# Patient Record
Sex: Male | Born: 1955 | Marital: Single
Health system: Southern US, Community
[De-identification: ages and names within clinical notes are randomized; demographics above are authoritative.]

---

## 2012-01-29 DEATH — deceased

## 2012-07-12 ENCOUNTER — Emergency Department: Payer: Self-pay | Admitting: Emergency Medicine

## 2012-07-12 LAB — CBC
HCT: 39.1 % — ABNORMAL LOW (ref 40.0–52.0)
HGB: 13.3 g/dL (ref 13.0–18.0)
MCH: 33.9 pg (ref 26.0–34.0)
MCV: 100 fL (ref 80–100)
RBC: 3.91 10*6/uL — ABNORMAL LOW (ref 4.40–5.90)
RDW: 17.1 % — ABNORMAL HIGH (ref 11.5–14.5)
WBC: 5.4 10*3/uL (ref 3.8–10.6)

## 2012-07-12 LAB — COMPREHENSIVE METABOLIC PANEL
Anion Gap: 10 (ref 7–16)
Bilirubin,Total: 1.2 mg/dL — ABNORMAL HIGH (ref 0.2–1.0)
Chloride: 105 mmol/L (ref 98–107)
Co2: 27 mmol/L (ref 21–32)
Creatinine: 1.46 mg/dL — ABNORMAL HIGH (ref 0.60–1.30)
EGFR (African American): >60
EGFR (Non-African Amer.): 53 — ABNORMAL LOW
Osmolality: 285 (ref 275–301)
Potassium: 3.8 mmol/L (ref 3.5–5.1)
Sodium: 142 mmol/L (ref 136–145)

## 2012-07-12 LAB — URINALYSIS, COMPLETE
Bacteria: NONE SEEN
Bilirubin,UR: NEGATIVE
Glucose,UR: NEGATIVE mg/dL (ref 0–75)
Leukocyte Esterase: NEGATIVE
Ph: 5 (ref 4.5–8.0)
RBC,UR: 1 /HPF (ref 0–5)
Squamous Epithelial: 1
WBC UR: 2 /HPF (ref 0–5)

## 2012-07-18 ENCOUNTER — Emergency Department: Payer: Self-pay | Admitting: Emergency Medicine

## 2012-07-18 LAB — CBC
Platelet: 131 10*3/uL — ABNORMAL LOW (ref 150–440)
RBC: 3.87 10*6/uL — ABNORMAL LOW (ref 4.40–5.90)
WBC: 8.7 10*3/uL (ref 3.8–10.6)

## 2012-07-18 LAB — COMPREHENSIVE METABOLIC PANEL
Albumin: 2.3 g/dL — ABNORMAL LOW (ref 3.4–5.0)
Alkaline Phosphatase: 123 U/L (ref 50–136)
Bilirubin,Total: 0.8 mg/dL (ref 0.2–1.0)
Calcium, Total: 8 mg/dL — ABNORMAL LOW (ref 8.5–10.1)
Co2: 27 mmol/L (ref 21–32)
Creatinine: 1.1 mg/dL (ref 0.60–1.30)
EGFR (Non-African Amer.): >60
SGOT(AST): 89 U/L — ABNORMAL HIGH (ref 15–37)
SGPT (ALT): 45 U/L (ref 12–78)

## 2012-07-18 LAB — PROTIME-INR
INR: 1.2
Prothrombin Time: 15.4 secs — ABNORMAL HIGH (ref 11.5–14.7)

## 2012-07-18 LAB — URINALYSIS, COMPLETE
Blood: NEGATIVE
Hyaline Cast: 1
Ketone: NEGATIVE
Nitrite: NEGATIVE
Protein: NEGATIVE
Specific Gravity: 1.012 (ref 1.003–1.030)
Squamous Epithelial: NONE SEEN

## 2012-07-18 LAB — MAGNESIUM: Magnesium: 1.8 mg/dL

## 2012-07-19 ENCOUNTER — Emergency Department: Payer: Self-pay | Admitting: Emergency Medicine

## 2012-07-19 LAB — TROPONIN I: Troponin-I: <0.02 ng/mL

## 2012-07-19 LAB — COMPREHENSIVE METABOLIC PANEL
Albumin: 2.4 g/dL — ABNORMAL LOW (ref 3.4–5.0)
Alkaline Phosphatase: 115 U/L (ref 50–136)
Anion Gap: 5 — ABNORMAL LOW (ref 7–16)
BUN: 13 mg/dL (ref 7–18)
Potassium: 4.8 mmol/L (ref 3.5–5.1)
SGOT(AST): 121 U/L — ABNORMAL HIGH (ref 15–37)
SGPT (ALT): 48 U/L (ref 12–78)
Total Protein: 9.1 g/dL — ABNORMAL HIGH (ref 6.4–8.2)

## 2012-07-19 LAB — URINALYSIS, COMPLETE
Glucose,UR: NEGATIVE mg/dL (ref 0–75)
Ketone: NEGATIVE
Leukocyte Esterase: NEGATIVE
Ph: 5 (ref 4.5–8.0)
Protein: NEGATIVE
RBC,UR: 1 /HPF (ref 0–5)
Squamous Epithelial: NONE SEEN

## 2012-07-19 LAB — PROTIME-INR
INR: 1.2
Prothrombin Time: 15.3 secs — ABNORMAL HIGH (ref 11.5–14.7)

## 2012-07-19 LAB — CBC
HCT: 36.9 % — ABNORMAL LOW (ref 40.0–52.0)
HGB: 12.7 g/dL — ABNORMAL LOW (ref 13.0–18.0)
MCH: 33.4 pg (ref 26.0–34.0)
MCV: 97 fL (ref 80–100)
Platelet: 118 10*3/uL — ABNORMAL LOW (ref 150–440)
RBC: 3.81 10*6/uL — ABNORMAL LOW (ref 4.40–5.90)
WBC: 6 10*3/uL (ref 3.8–10.6)

## 2012-07-19 LAB — CK TOTAL AND CKMB (NOT AT ARMC)
CK, Total: 222 U/L (ref 35–232)
CK-MB: 1.7 ng/mL (ref 0.5–3.6)

## 2012-07-23 ENCOUNTER — Emergency Department: Payer: Self-pay | Admitting: Emergency Medicine

## 2012-07-23 LAB — COMPREHENSIVE METABOLIC PANEL
Alkaline Phosphatase: 103 U/L (ref 50–136)
Anion Gap: 7 (ref 7–16)
Bilirubin,Total: 1.7 mg/dL — ABNORMAL HIGH (ref 0.2–1.0)
Chloride: 109 mmol/L — ABNORMAL HIGH (ref 98–107)
Co2: 28 mmol/L (ref 21–32)
Creatinine: 1.27 mg/dL (ref 0.60–1.30)
EGFR (African American): >60
EGFR (Non-African Amer.): >60
Osmolality: 289 (ref 275–301)
Potassium: 3.7 mmol/L (ref 3.5–5.1)
Sodium: 144 mmol/L (ref 136–145)

## 2012-07-23 LAB — CBC
HGB: 12.2 g/dL — ABNORMAL LOW (ref 13.0–18.0)
MCH: 33.3 pg (ref 26.0–34.0)
MCHC: 33.9 g/dL (ref 32.0–36.0)
MCV: 98 fL (ref 80–100)
RBC: 3.65 10*6/uL — ABNORMAL LOW (ref 4.40–5.90)
RDW: 16.4 % — ABNORMAL HIGH (ref 11.5–14.5)

## 2012-07-27 ENCOUNTER — Emergency Department: Payer: Self-pay | Admitting: Unknown Physician Specialty

## 2012-07-27 LAB — COMPREHENSIVE METABOLIC PANEL
Alkaline Phosphatase: 103 U/L (ref 50–136)
Calcium, Total: 8.1 mg/dL — ABNORMAL LOW (ref 8.5–10.1)
Chloride: 107 mmol/L (ref 98–107)
Co2: 24 mmol/L (ref 21–32)
Creatinine: 1.17 mg/dL (ref 0.60–1.30)
Osmolality: 283 (ref 275–301)
Sodium: 139 mmol/L (ref 136–145)

## 2012-07-27 LAB — URINALYSIS, COMPLETE
Nitrite: NEGATIVE
Ph: 5 (ref 4.5–8.0)
Protein: NEGATIVE
RBC,UR: <1 /HPF (ref 0–5)
Squamous Epithelial: NONE SEEN
WBC UR: <1 /HPF (ref 0–5)

## 2012-07-27 LAB — CBC
HCT: 35.7 % — ABNORMAL LOW (ref 40.0–52.0)
HGB: 11.8 g/dL — ABNORMAL LOW (ref 13.0–18.0)
MCHC: 33 g/dL (ref 32.0–36.0)
RBC: 3.64 10*6/uL — ABNORMAL LOW (ref 4.40–5.90)
RDW: 16.3 % — ABNORMAL HIGH (ref 11.5–14.5)

## 2012-07-27 LAB — LIPASE, BLOOD: Lipase: 348 U/L (ref 73–393)

## 2012-07-27 LAB — PROTIME-INR
INR: 1.2
Prothrombin Time: 15.7 secs — ABNORMAL HIGH (ref 11.5–14.7)

## 2012-07-27 LAB — AMMONIA: Ammonia, Plasma: 53 mcmol/L — ABNORMAL HIGH (ref 11–32)

## 2012-08-03 ENCOUNTER — Emergency Department: Payer: Self-pay | Admitting: Emergency Medicine

## 2012-08-07 ENCOUNTER — Emergency Department: Payer: Self-pay | Admitting: *Deleted

## 2012-08-15 ENCOUNTER — Emergency Department: Payer: Self-pay | Admitting: *Deleted

## 2012-08-15 LAB — CBC
HCT: 38.9 % — ABNORMAL LOW (ref 40.0–52.0)
HGB: 13.2 g/dL (ref 13.0–18.0)
MCH: 33.6 pg (ref 26.0–34.0)
MCHC: 34.1 g/dL (ref 32.0–36.0)
MCV: 99 fL (ref 80–100)
Platelet: 108 x10 3/mm 3 — ABNORMAL LOW (ref 150–440)
RBC: 3.94 x10 6/mm 3 — ABNORMAL LOW (ref 4.40–5.90)
RDW: 18.2 % — ABNORMAL HIGH (ref 11.5–14.5)
WBC: 6.3 x10 3/mm 3 (ref 3.8–10.6)

## 2012-08-15 LAB — COMPREHENSIVE METABOLIC PANEL
Alkaline Phosphatase: 129 U/L (ref 50–136)
Anion Gap: 9 (ref 7–16)
Calcium, Total: 8.1 mg/dL — ABNORMAL LOW (ref 8.5–10.1)
Co2: 20 mmol/L — ABNORMAL LOW (ref 21–32)
Creatinine: 1.17 mg/dL (ref 0.60–1.30)
EGFR (Non-African Amer.): >60
SGOT(AST): 129 U/L — ABNORMAL HIGH (ref 15–37)
SGPT (ALT): 48 U/L (ref 12–78)

## 2012-08-15 LAB — AMMONIA: Ammonia, Plasma: 61 mcmol/L — ABNORMAL HIGH (ref 11–32)

## 2012-08-15 LAB — CK TOTAL AND CKMB (NOT AT ARMC)
CK, Total: 153 U/L (ref 35–232)
CK-MB: 2.1 ng/mL (ref 0.5–3.6)

## 2012-08-20 ENCOUNTER — Emergency Department: Payer: Self-pay | Admitting: Emergency Medicine

## 2012-08-20 LAB — CBC
HCT: 36.2 % — ABNORMAL LOW (ref 40.0–52.0)
HGB: 12.2 g/dL — ABNORMAL LOW (ref 13.0–18.0)
MCHC: 33.8 g/dL (ref 32.0–36.0)
MCV: 99 fL (ref 80–100)
RDW: 18.1 % — ABNORMAL HIGH (ref 11.5–14.5)

## 2012-08-20 LAB — COMPREHENSIVE METABOLIC PANEL
Albumin: 2 g/dL — ABNORMAL LOW (ref 3.4–5.0)
Alkaline Phosphatase: 129 U/L (ref 50–136)
Anion Gap: 7 (ref 7–16)
BUN: 7 mg/dL (ref 7–18)
Bilirubin,Total: 1.4 mg/dL — ABNORMAL HIGH (ref 0.2–1.0)
Calcium, Total: 8.1 mg/dL — ABNORMAL LOW (ref 8.5–10.1)
Chloride: 109 mmol/L — ABNORMAL HIGH (ref 98–107)
Co2: 25 mmol/L (ref 21–32)
Creatinine: 1.2 mg/dL (ref 0.60–1.30)
EGFR (African American): >60
EGFR (Non-African Amer.): >60
Osmolality: 282 (ref 275–301)
SGPT (ALT): 56 U/L (ref 12–78)
Sodium: 141 mmol/L (ref 136–145)
Total Protein: 7.9 g/dL (ref 6.4–8.2)

## 2012-08-20 LAB — LIPASE, BLOOD: Lipase: 644 U/L — ABNORMAL HIGH (ref 73–393)

## 2012-08-21 ENCOUNTER — Emergency Department: Payer: Self-pay | Admitting: Unknown Physician Specialty

## 2012-08-27 ENCOUNTER — Emergency Department: Payer: Self-pay | Admitting: Internal Medicine

## 2012-09-05 LAB — COMPREHENSIVE METABOLIC PANEL
Albumin: 2.3 g/dL — ABNORMAL LOW (ref 3.4–5.0)
Anion Gap: 11 (ref 7–16)
BUN: 9 mg/dL (ref 7–18)
Calcium, Total: 8.1 mg/dL — ABNORMAL LOW (ref 8.5–10.1)
Chloride: 110 mmol/L — ABNORMAL HIGH (ref 98–107)
EGFR (African American): >60
Glucose: 126 mg/dL — ABNORMAL HIGH (ref 65–99)
Osmolality: 285 (ref 275–301)
Potassium: 3.8 mmol/L (ref 3.5–5.1)
SGOT(AST): 243 U/L — ABNORMAL HIGH (ref 15–37)
SGPT (ALT): 79 U/L — ABNORMAL HIGH (ref 12–78)

## 2012-09-05 LAB — CBC
MCHC: 34.6 g/dL (ref 32.0–36.0)
MCV: 101 fL — ABNORMAL HIGH (ref 80–100)
Platelet: 107 10*3/uL — ABNORMAL LOW (ref 150–440)
RBC: 3.69 10*6/uL — ABNORMAL LOW (ref 4.40–5.90)
WBC: 7.8 10*3/uL (ref 3.8–10.6)

## 2012-09-05 LAB — URINALYSIS, COMPLETE
Bacteria: NONE SEEN
Bilirubin,UR: NEGATIVE
Blood: NEGATIVE
Glucose,UR: NEGATIVE mg/dL (ref 0–75)
Hyaline Cast: 6
Ketone: NEGATIVE
Nitrite: NEGATIVE
Specific Gravity: 1.014 (ref 1.003–1.030)
Squamous Epithelial: <1
WBC UR: <1 /HPF (ref 0–5)

## 2012-09-05 LAB — PROTIME-INR: Prothrombin Time: 16.8 secs — ABNORMAL HIGH (ref 11.5–14.7)

## 2012-09-05 LAB — AMMONIA: Ammonia, Plasma: 94 mcmol/L — ABNORMAL HIGH (ref 11–32)

## 2012-09-05 LAB — LIPASE, BLOOD: Lipase: 459 U/L — ABNORMAL HIGH (ref 73–393)

## 2012-09-06 ENCOUNTER — Inpatient Hospital Stay: Payer: Self-pay | Admitting: Internal Medicine

## 2012-09-06 LAB — APTT: Activated PTT: 29.8 secs (ref 23.6–35.9)

## 2012-09-07 DIAGNOSIS — R000 Tachycardia, unspecified: Secondary | ICD-10-CM

## 2012-09-07 LAB — CBC WITH DIFFERENTIAL/PLATELET
Basophil %: 0.5 %
Eosinophil %: 9.1 %
HCT: 33.6 % — ABNORMAL LOW (ref 40.0–52.0)
Lymphocyte %: 21.6 %
MCH: 34.7 pg — ABNORMAL HIGH (ref 26.0–34.0)
Monocyte #: 0.8 x10 3/mm (ref 0.2–1.0)
Neutrophil %: 55.5 %
Platelet: 106 10*3/uL — ABNORMAL LOW (ref 150–440)
RBC: 3.33 10*6/uL — ABNORMAL LOW (ref 4.40–5.90)
WBC: 5.7 10*3/uL (ref 3.8–10.6)

## 2012-09-07 LAB — COMPREHENSIVE METABOLIC PANEL
Albumin: 1.8 g/dL — ABNORMAL LOW (ref 3.4–5.0)
Alkaline Phosphatase: 125 U/L (ref 50–136)
BUN: 9 mg/dL (ref 7–18)
Calcium, Total: 7.5 mg/dL — ABNORMAL LOW (ref 8.5–10.1)
Creatinine: 1.24 mg/dL (ref 0.60–1.30)
EGFR (African American): >60
EGFR (Non-African Amer.): >60
Glucose: 131 mg/dL — ABNORMAL HIGH (ref 65–99)
Osmolality: 289 (ref 275–301)
SGPT (ALT): 67 U/L (ref 12–78)
Sodium: 145 mmol/L (ref 136–145)

## 2012-09-07 LAB — LIPASE, BLOOD: Lipase: 360 U/L (ref 73–393)

## 2012-09-08 DIAGNOSIS — I369 Nonrheumatic tricuspid valve disorder, unspecified: Secondary | ICD-10-CM

## 2012-09-08 LAB — CULTURE, BLOOD (SINGLE)

## 2012-09-08 LAB — URINE CULTURE

## 2012-09-10 LAB — CBC WITH DIFFERENTIAL/PLATELET
Basophil #: 0 10*3/uL (ref 0.0–0.1)
Eosinophil #: 0.6 10*3/uL (ref 0.0–0.7)
Eosinophil %: 8.1 %
HCT: 31.9 % — ABNORMAL LOW (ref 40.0–52.0)
HGB: 11.2 g/dL — ABNORMAL LOW (ref 13.0–18.0)
Lymphocyte #: 1.6 10*3/uL (ref 1.0–3.6)
Lymphocyte %: 20.8 %
MCHC: 35.2 g/dL (ref 32.0–36.0)
Monocyte %: 15 %
Neutrophil #: 4.3 10*3/uL (ref 1.4–6.5)
Neutrophil %: 55.6 %
RBC: 3.15 10*6/uL — ABNORMAL LOW (ref 4.40–5.90)
RDW: 18.3 % — ABNORMAL HIGH (ref 11.5–14.5)
WBC: 7.7 10*3/uL (ref 3.8–10.6)

## 2012-09-10 LAB — BASIC METABOLIC PANEL
BUN: 17 mg/dL (ref 7–18)
Chloride: 110 mmol/L — ABNORMAL HIGH (ref 98–107)
EGFR (African American): 41 — ABNORMAL LOW
EGFR (Non-African Amer.): 35 — ABNORMAL LOW
Glucose: 129 mg/dL — ABNORMAL HIGH (ref 65–99)
Osmolality: 286 (ref 275–301)
Potassium: 3.9 mmol/L (ref 3.5–5.1)
Sodium: 142 mmol/L (ref 136–145)

## 2012-09-11 LAB — BASIC METABOLIC PANEL
Anion Gap: 7 (ref 7–16)
BUN: 16 mg/dL (ref 7–18)
Calcium, Total: 6.7 mg/dL — CL (ref 8.5–10.1)
Chloride: 113 mmol/L — ABNORMAL HIGH (ref 98–107)
Co2: 23 mmol/L (ref 21–32)
Creatinine: 1.78 mg/dL — ABNORMAL HIGH (ref 0.60–1.30)
Osmolality: 285 (ref 275–301)
Potassium: 3.9 mmol/L (ref 3.5–5.1)

## 2012-09-11 LAB — CBC WITH DIFFERENTIAL/PLATELET
Bands: 1 %
HGB: 9.8 g/dL — ABNORMAL LOW (ref 13.0–18.0)
Lymphocytes: 24 %
Platelet: 109 10*3/uL — ABNORMAL LOW (ref 150–440)
RBC: 2.91 10*6/uL — ABNORMAL LOW (ref 4.40–5.90)
RDW: 18.2 % — ABNORMAL HIGH (ref 11.5–14.5)
Segmented Neutrophils: 62 %
Variant Lymphocyte - H1-Rlymph: 1 %

## 2012-09-11 LAB — ALBUMIN: Albumin: 1.6 g/dL — ABNORMAL LOW (ref 3.4–5.0)

## 2012-09-12 DIAGNOSIS — R7881 Bacteremia: Secondary | ICD-10-CM

## 2012-09-12 LAB — COMPREHENSIVE METABOLIC PANEL
Albumin: 1.7 g/dL — ABNORMAL LOW (ref 3.4–5.0)
BUN: 14 mg/dL (ref 7–18)
Chloride: 112 mmol/L — ABNORMAL HIGH (ref 98–107)
Co2: 23 mmol/L (ref 21–32)
Glucose: 150 mg/dL — ABNORMAL HIGH (ref 65–99)
Osmolality: 285 (ref 275–301)
Potassium: 4.4 mmol/L (ref 3.5–5.1)
SGOT(AST): 356 U/L — ABNORMAL HIGH (ref 15–37)
Total Protein: 6.9 g/dL (ref 6.4–8.2)

## 2012-09-12 LAB — CLOSTRIDIUM DIFFICILE BY PCR

## 2012-09-13 LAB — BASIC METABOLIC PANEL
BUN: 12 mg/dL (ref 7–18)
Calcium, Total: 7.9 mg/dL — ABNORMAL LOW (ref 8.5–10.1)
Chloride: 109 mmol/L — ABNORMAL HIGH (ref 98–107)
Co2: 24 mmol/L (ref 21–32)
Creatinine: 1.95 mg/dL — ABNORMAL HIGH (ref 0.60–1.30)
EGFR (African American): 43 — ABNORMAL LOW
EGFR (Non-African Amer.): 37 — ABNORMAL LOW
Osmolality: 280 (ref 275–301)

## 2012-09-14 LAB — URINALYSIS, COMPLETE
Bacteria: NONE SEEN
Glucose,UR: NEGATIVE mg/dL (ref 0–75)
Ketone: NEGATIVE
Nitrite: NEGATIVE
Ph: 5 (ref 4.5–8.0)
Protein: NEGATIVE
Squamous Epithelial: NONE SEEN
WBC UR: 3 /HPF (ref 0–5)

## 2012-09-14 LAB — SODIUM, URINE, RANDOM: Sodium, Urine Random: 69 mmol/L (ref 20–110)

## 2012-09-14 LAB — PROTEIN / CREATININE RATIO, URINE: Creatinine, Urine: 70.3 mg/dL (ref 30.0–125.0)

## 2012-09-15 LAB — PROTEIN ELECTROPHORESIS(ARMC)

## 2012-09-16 LAB — BASIC METABOLIC PANEL
Anion Gap: 8 (ref 7–16)
BUN: 12 mg/dL (ref 7–18)
EGFR (African American): 52 — ABNORMAL LOW
EGFR (Non-African Amer.): 45 — ABNORMAL LOW
Glucose: 137 mg/dL — ABNORMAL HIGH (ref 65–99)
Osmolality: 285 (ref 275–301)
Potassium: 4.1 mmol/L (ref 3.5–5.1)
Sodium: 142 mmol/L (ref 136–145)

## 2012-09-18 LAB — BASIC METABOLIC PANEL
Anion Gap: 10 (ref 7–16)
BUN: 12 mg/dL (ref 7–18)
Chloride: 107 mmol/L (ref 98–107)
Co2: 24 mmol/L (ref 21–32)
Creatinine: 1.74 mg/dL — ABNORMAL HIGH (ref 0.60–1.30)
EGFR (African American): 50 — ABNORMAL LOW
Osmolality: 282 (ref 275–301)
Potassium: 4 mmol/L (ref 3.5–5.1)

## 2012-09-19 LAB — CREATININE, SERUM: EGFR (Non-African Amer.): 41 — ABNORMAL LOW

## 2012-09-20 LAB — RENAL FUNCTION PANEL
Albumin: 2.4 g/dL — ABNORMAL LOW (ref 3.4–5.0)
Anion Gap: 9 (ref 7–16)
Calcium, Total: 8 mg/dL — ABNORMAL LOW (ref 8.5–10.1)
Chloride: 108 mmol/L — ABNORMAL HIGH (ref 98–107)
Co2: 25 mmol/L (ref 21–32)
Creatinine: 1.88 mg/dL — ABNORMAL HIGH (ref 0.60–1.30)
EGFR (Non-African Amer.): 39 — ABNORMAL LOW
Glucose: 137 mg/dL — ABNORMAL HIGH (ref 65–99)
Phosphorus: 2.8 mg/dL (ref 2.5–4.9)
Sodium: 142 mmol/L (ref 136–145)

## 2014-07-09 IMAGING — CR DG CHEST 2V
1 series · 2 of 2 positions shown · non-contrast
Comparison: none

REASON FOR EXAM: dyspnea
COMMENTS:

[Series 5: w chest pa · 0.14mm/px · 2 of 2 slices shown]
[im 1/2]
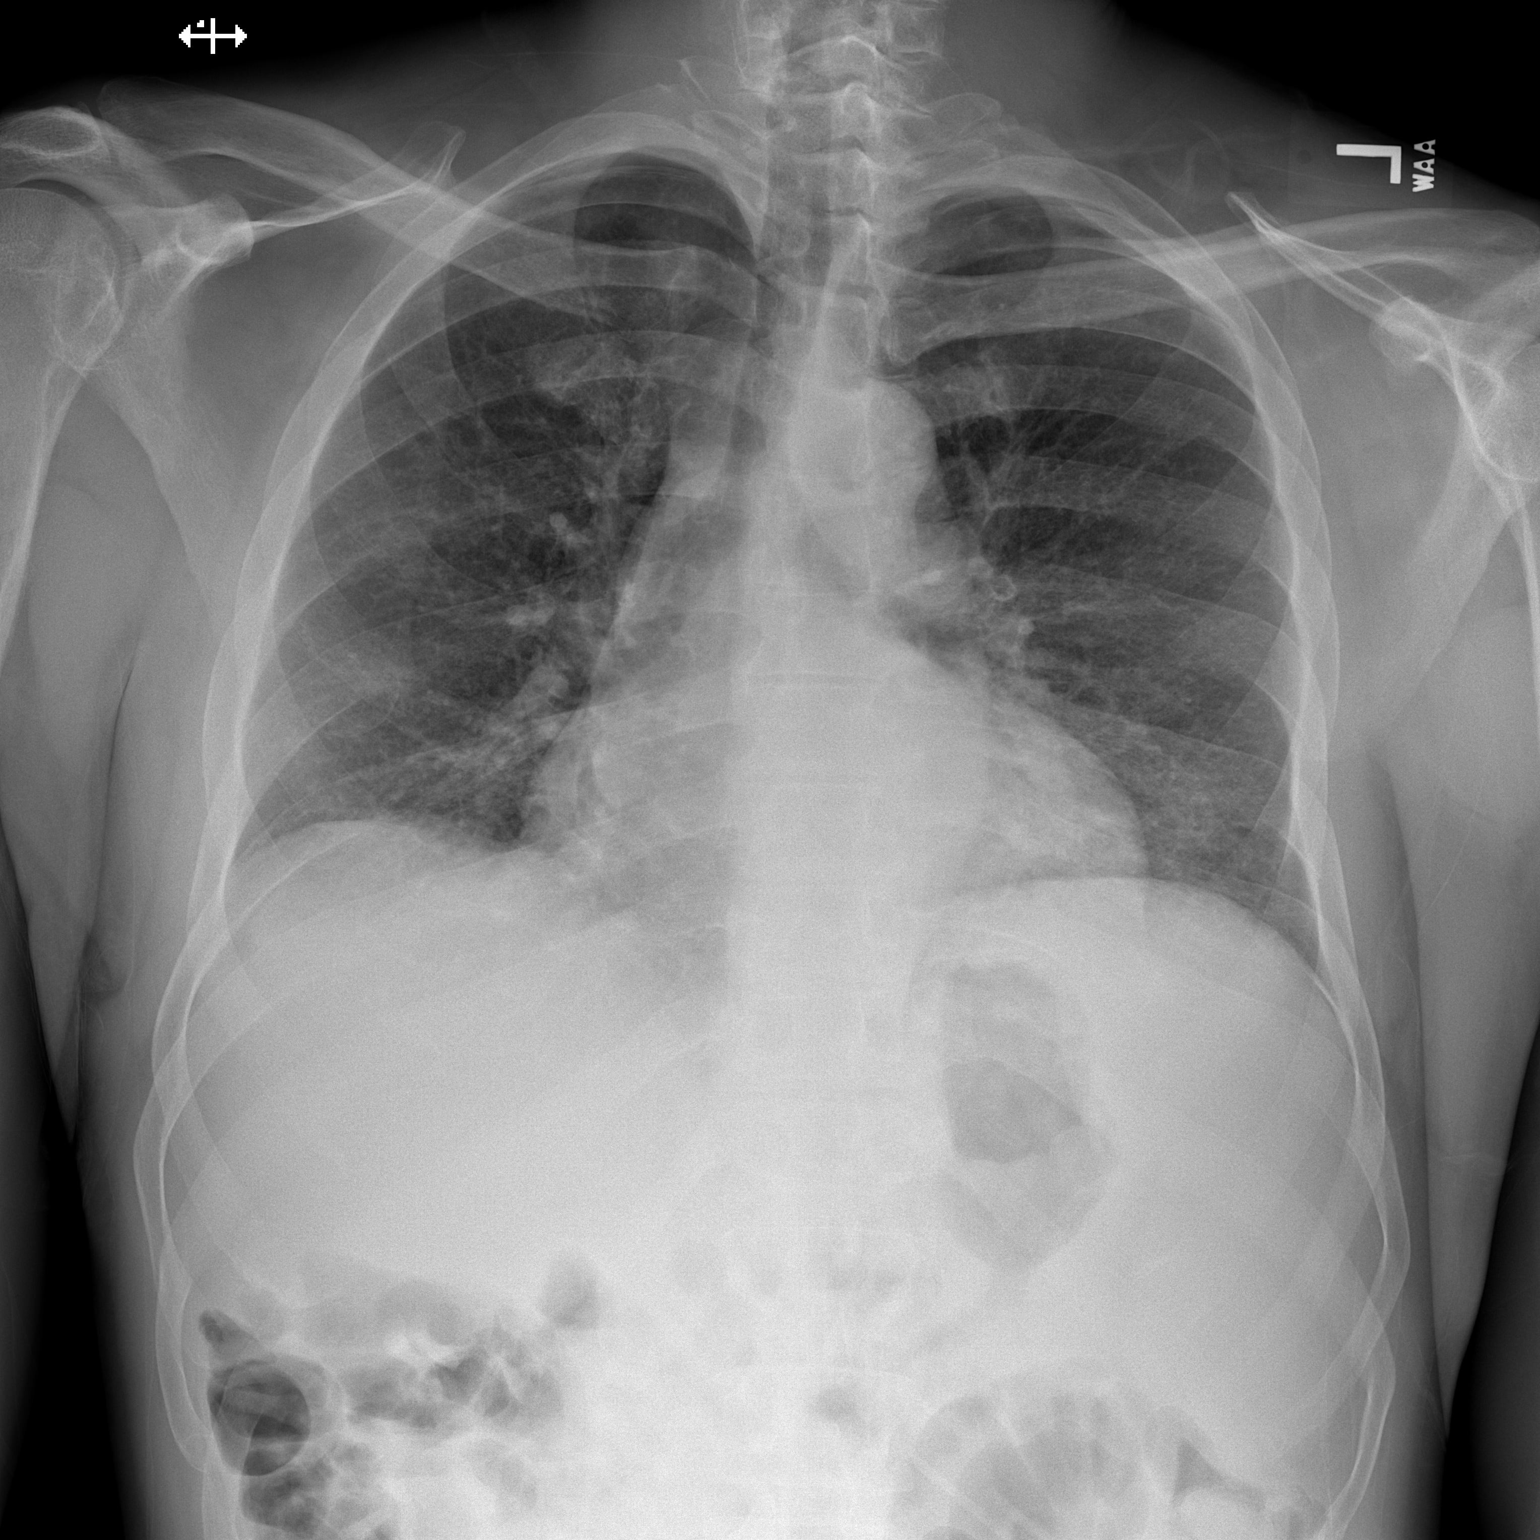
[im 2/2]
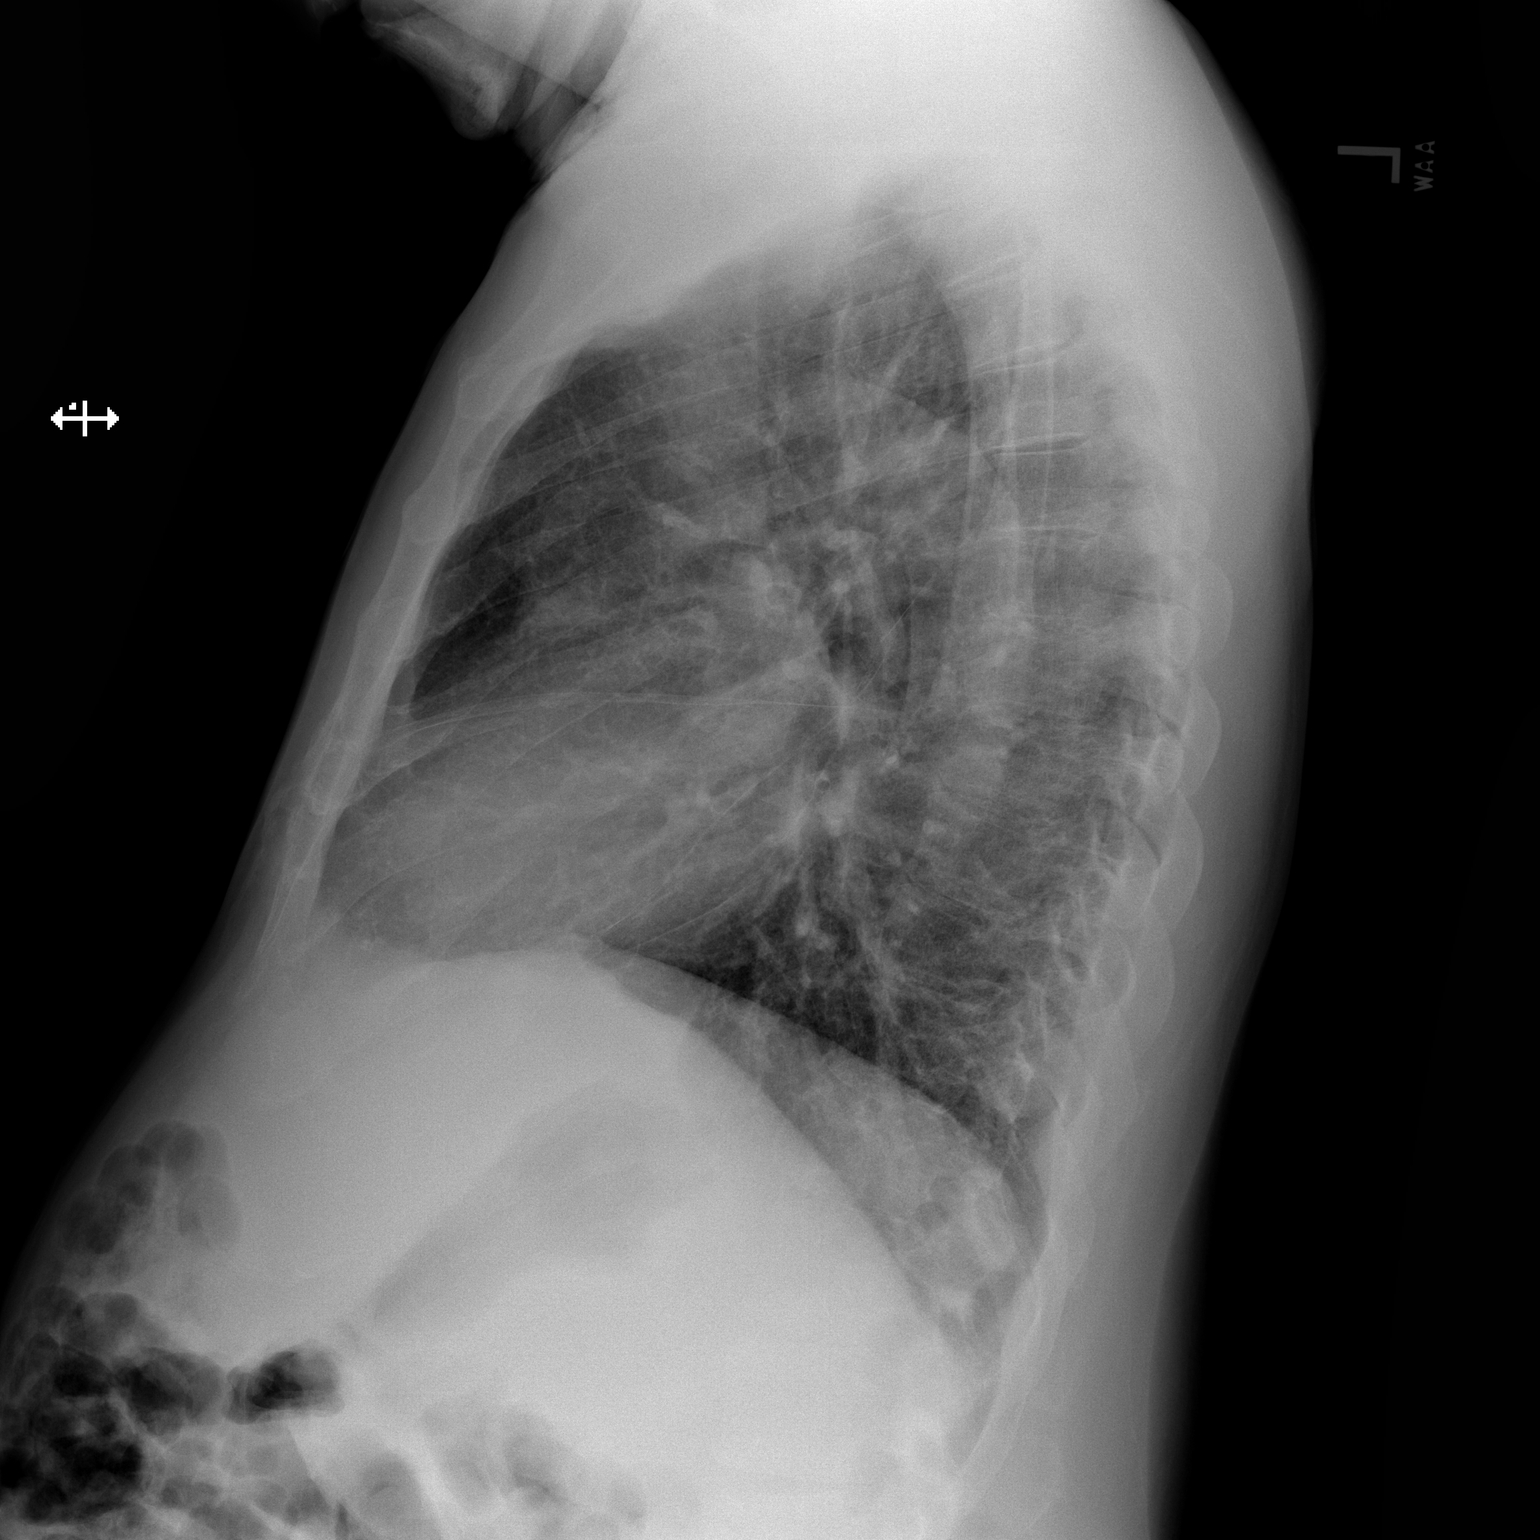

[2 of 2 positions shown; findings below may reference images not displayed]

PROCEDURE:     DXR - DXR CHEST PA (OR AP) AND LATERAL  - July 19, 2012  [DATE]

RESULT:     Comparison is made to the study 18 July, 2012.

The lungs are less well inflated and the interstitial markings are more
conspicuous today. There is no focal infiltrate. The cardiac silhouette is
top normal in size. There is no significant pleural fluid collection. The
mediastinum is normal in width. There is tortuosity of the descending
thoracic aorta.
IMPRESSION: The findings suggest minimal interstitial edema today.
There is no focal pneumonia nor evidence of pulmonary alveolar edema.

[REDACTED]

## 2014-08-27 IMAGING — US US GUIDE NEEDLE - US PARA
1 series · 10 of 10 positions shown · non-contrast
Comparison: none

REASON FOR EXAM: Tense ascites.
COMMENTS:

PROCEDURE:     US  - US GUIDED PARACENTESIS  - September 06, 2012  [DATE]
RESULT:     Minimal ascites noted. Paracentesis not performed.

[Series 1: us guide needle - us para · 0.31mm/px · 10 of 10 slices shown]
[im 1/10]
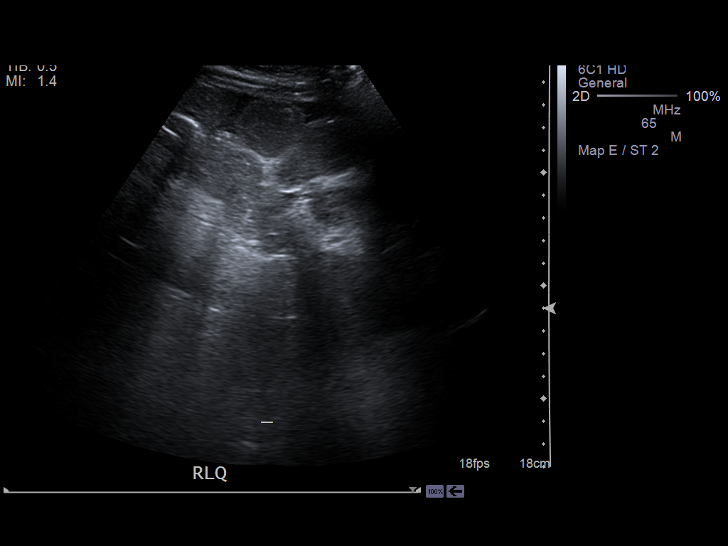
[im 2/10]
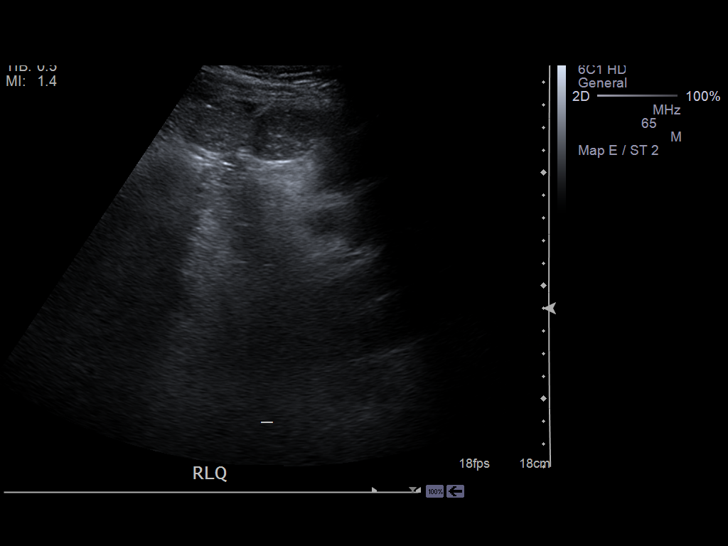
[im 3/10]
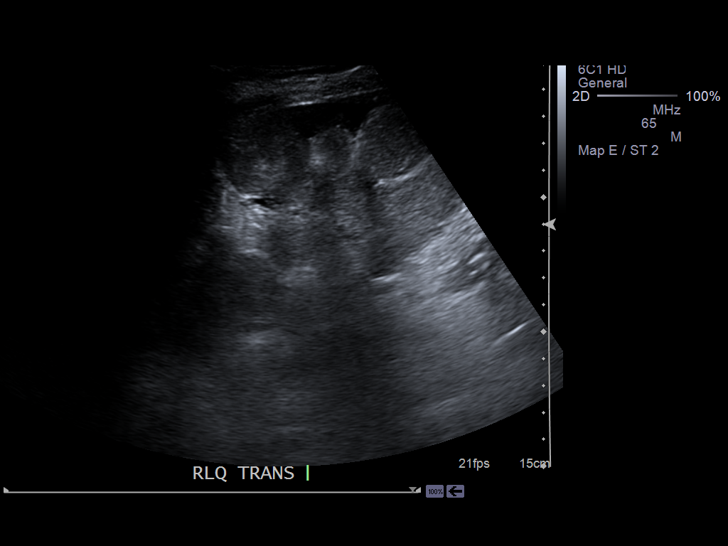
[im 4/10]
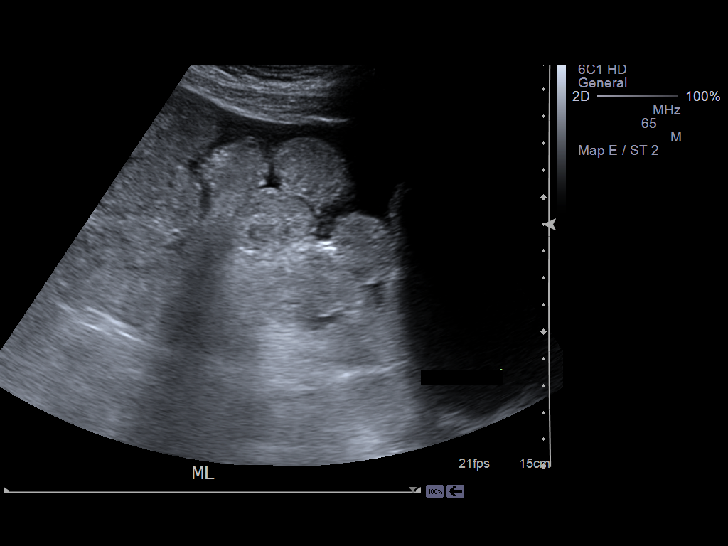
[im 5/10]
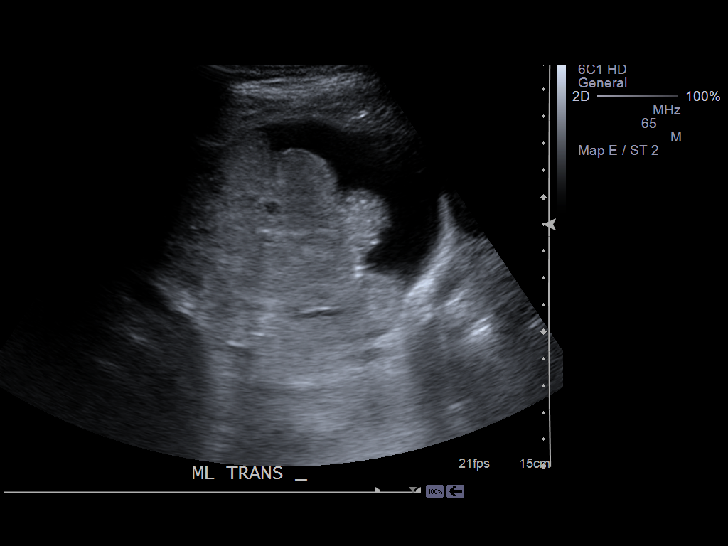
[im 6/10]
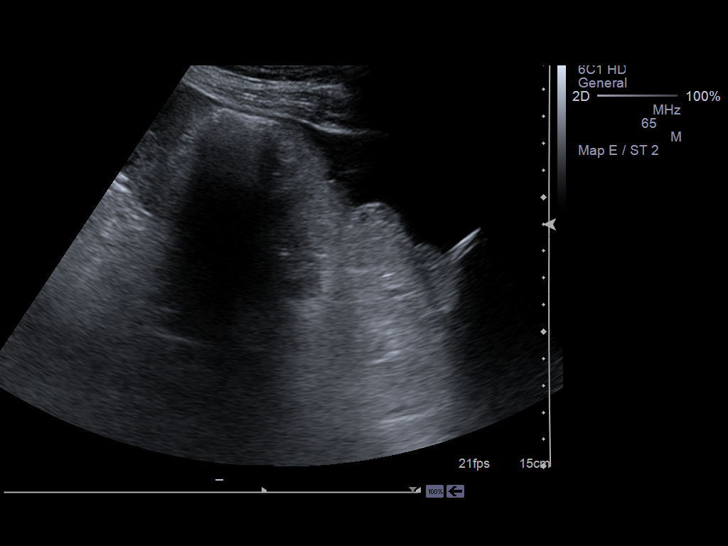
[im 7/10]
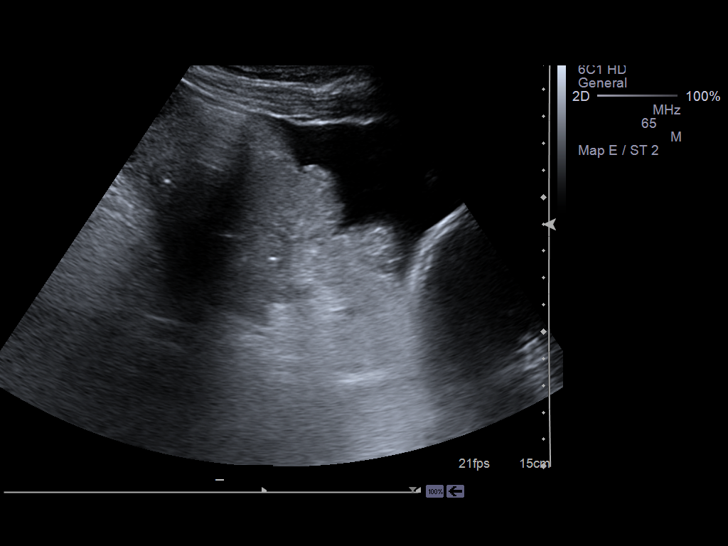
[im 8/10]
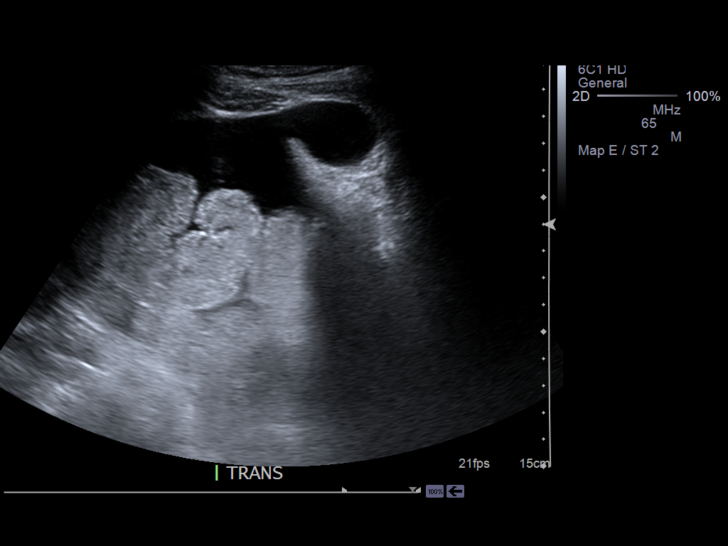
[im 9/10]
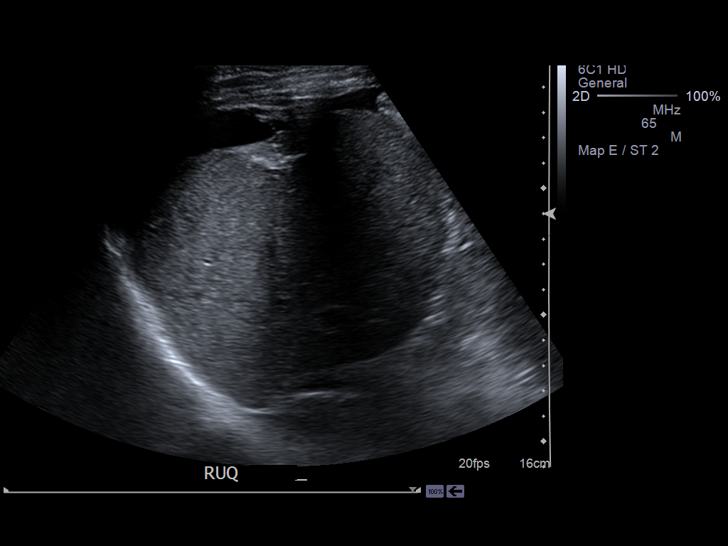
[im 10/10]
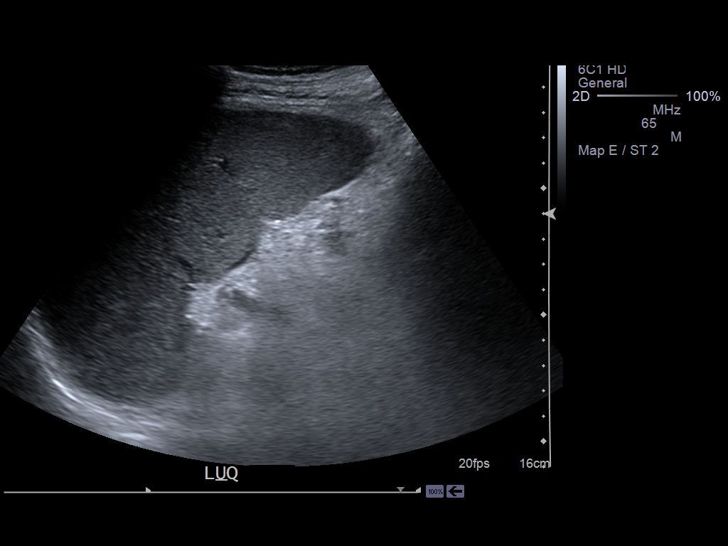

[10 of 10 positions shown; findings below may reference images not displayed]

IMPRESSION: Minimal amount of ascites.

## 2014-08-30 IMAGING — US US EXTREM LOW VENOUS BILAT
1 series · 14 of 24 positions shown · non-contrast
Comparison: none

REASON FOR EXAM: leg swelling- r/o DVT
COMMENTS:

PROCEDURE:     US  - US DOPPLER LOW EXTR BILATERAL  - September 09, 2012  [DATE]
RESULT:     Comparison: None

[Series 1: us extrem low venous bilat · 0.11mm/px · 14 of 50 slices shown]
[im 1/50]
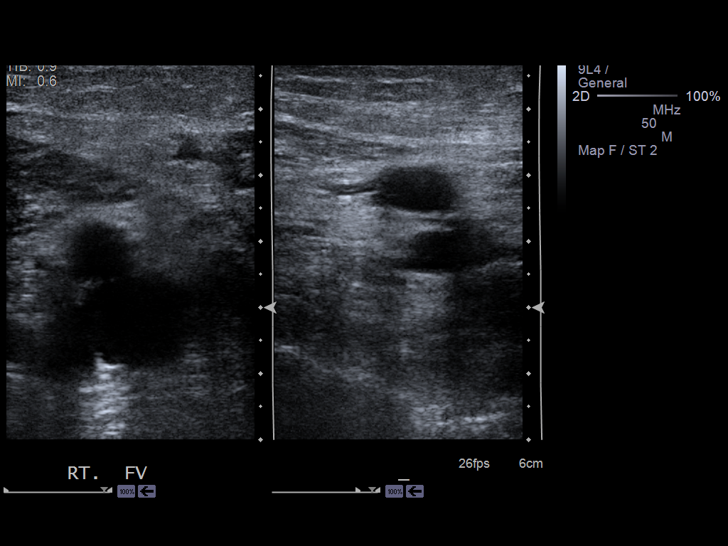
[im 5/50]
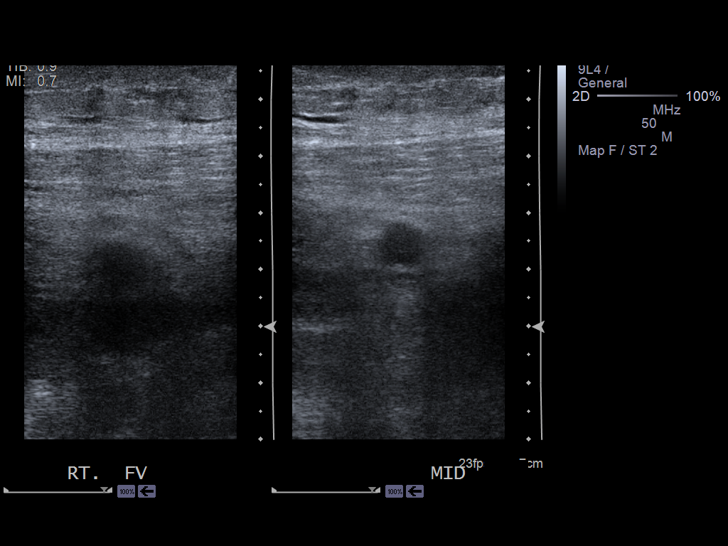
[im 9/50]
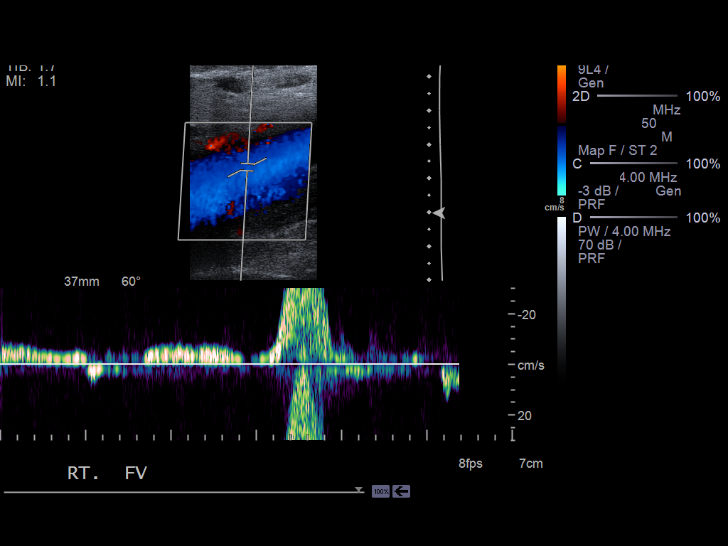
[im 13/50]
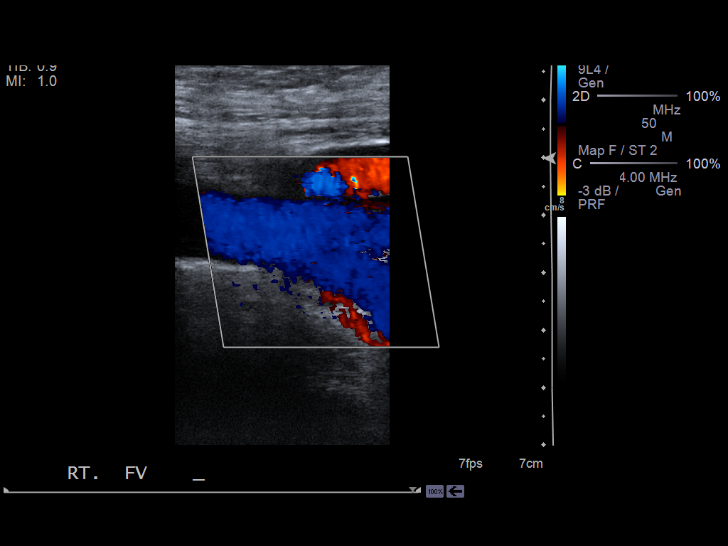
[im 15/50]
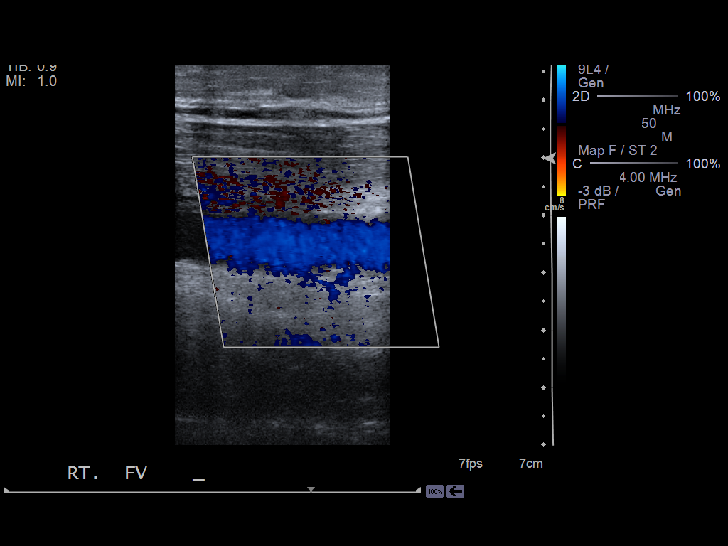
[im 20/50]
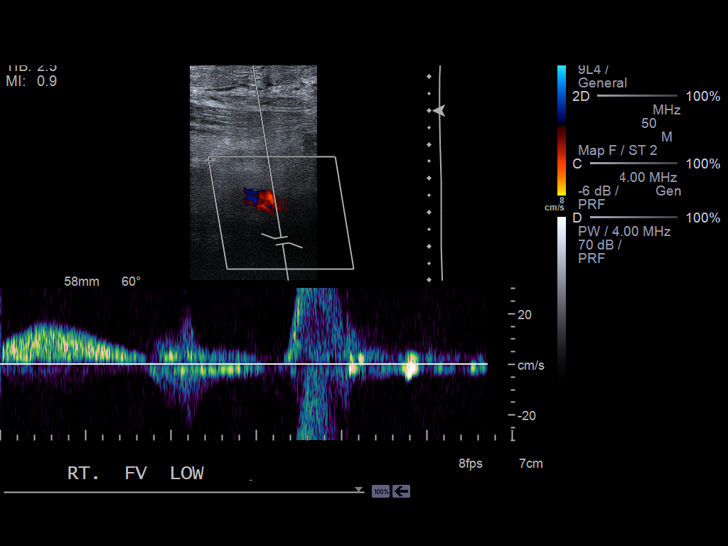
[im 24/50]
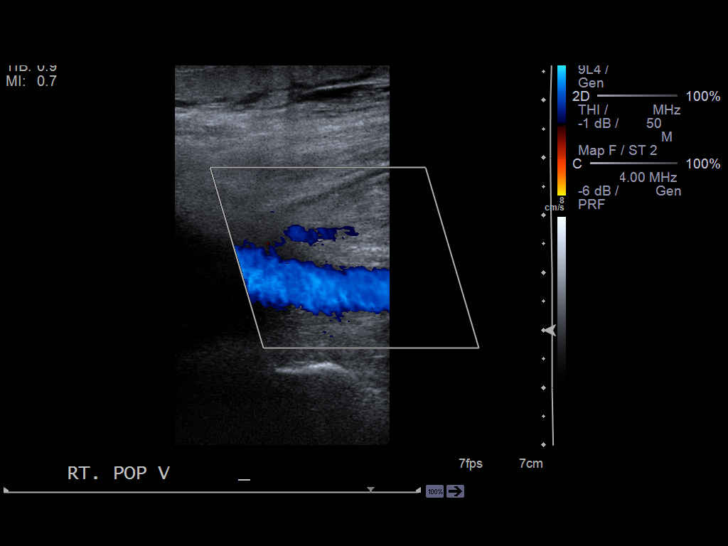
[im 26/50]
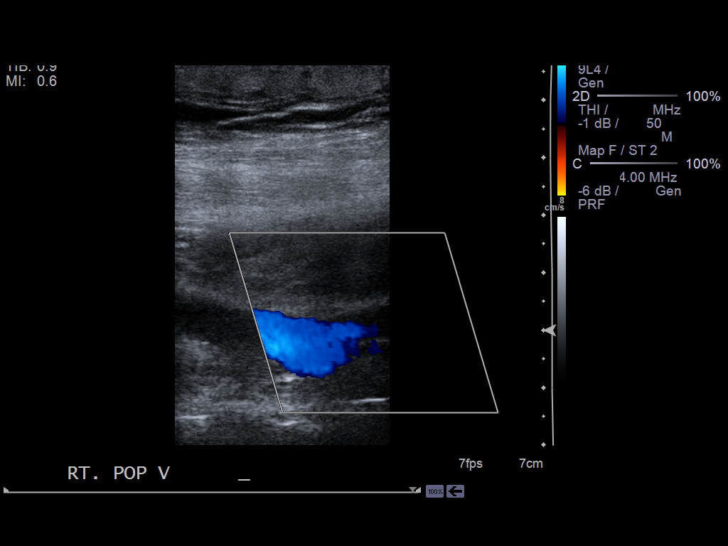
[im 30/50]
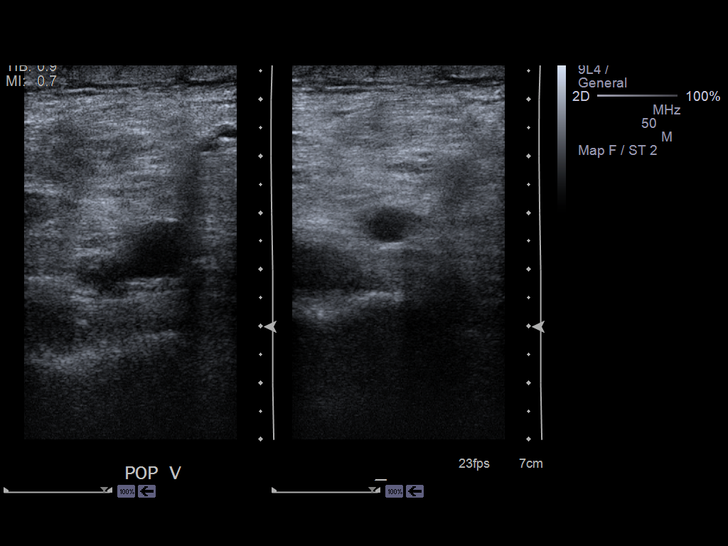
[im 35/50]
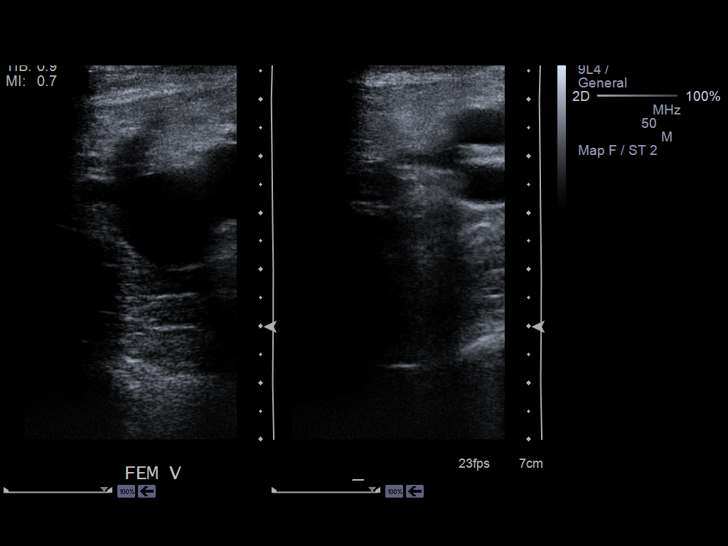
[im 39/50]
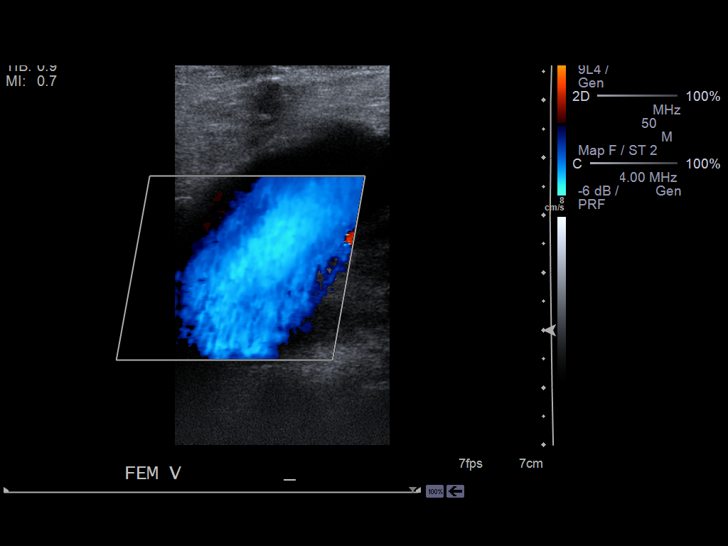
[im 41/50]
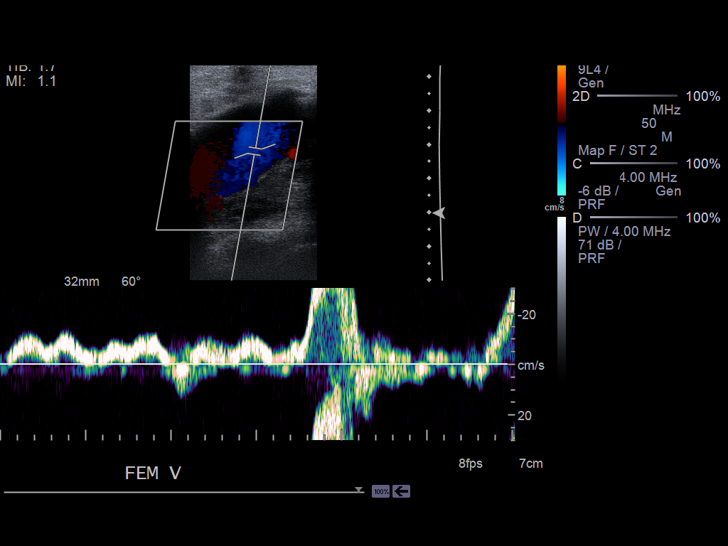
[im 45/50]
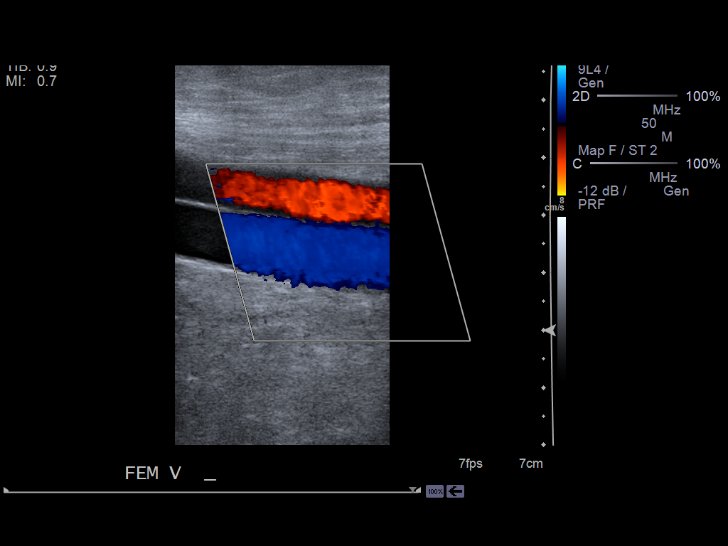
[im 50/50]
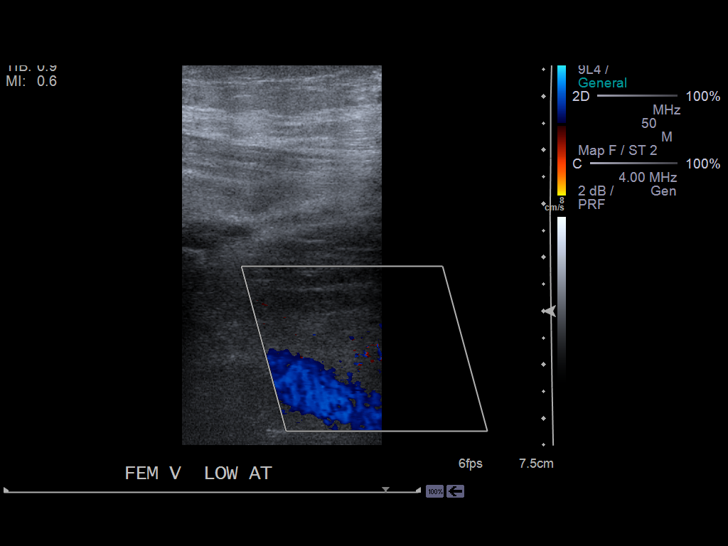

[14 of 24 positions shown; findings below may reference images not displayed]

FINDINGS: Multiple longitudinal and transverse gray-scale as well as color
and spectral Doppler images of the bilateral lower extremity veins were
obtained from the common femoral veins through the popliteal veins.

The bilateral common femoral, femoral, and popliteal veins are patent,
demonstrating normal color-flow and compressibility. No intraluminal
thrombus is identified.There is normal respiratory variation and
augmentation demonstrated at all vein levels.
IMPRESSION: No evidence of DVT in the bilateral lower extremities.

## 2014-09-02 IMAGING — US ABDOMEN ULTRASOUND LIMITED
1 series · 10 of 10 positions shown · non-contrast
Comparison: none

REASON FOR EXAM: abdomen distention
COMMENTS:   Body Site: Ascites search - Abd. Quadrants imaged for free fluid

[Series 1: abdomen ultrasound limited · 0.31mm/px · 10 acquisitions, 10 frames shown]
[im 1/10]
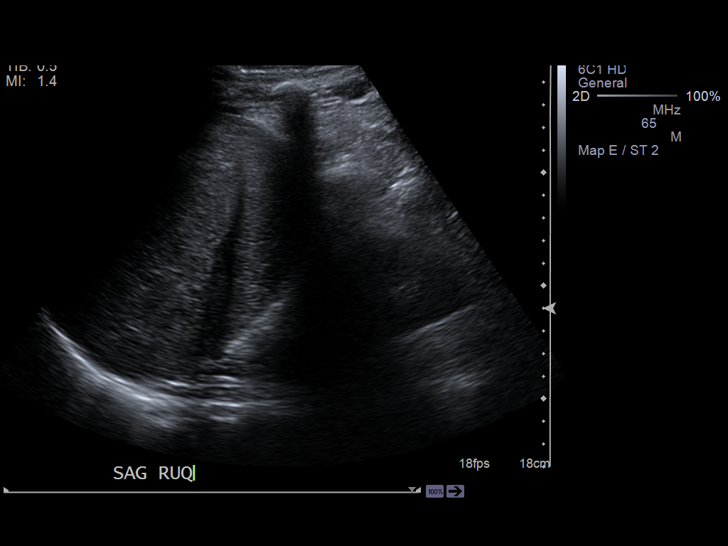
[im 2/10]
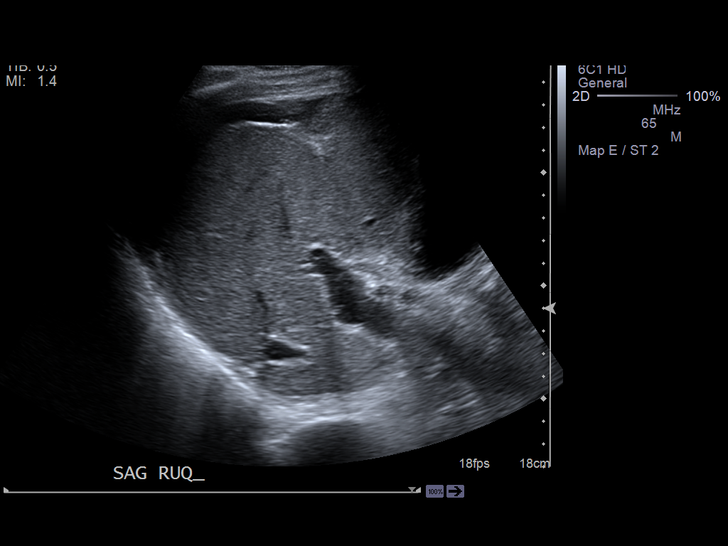
[im 3/10]
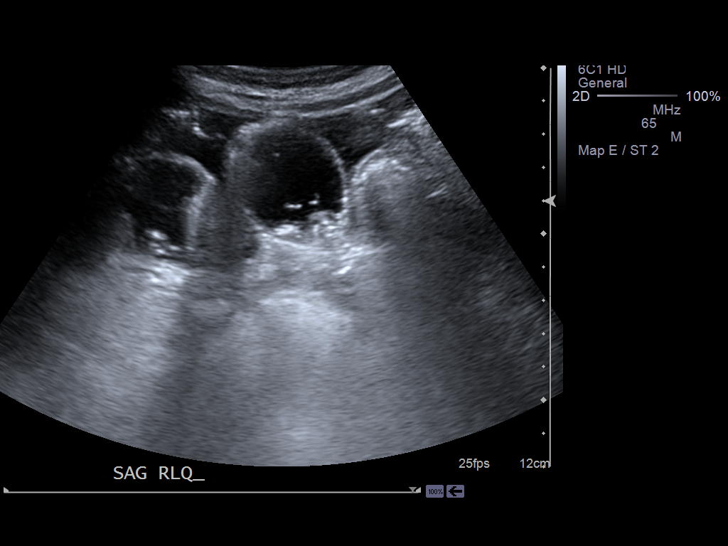
[im 4/10]
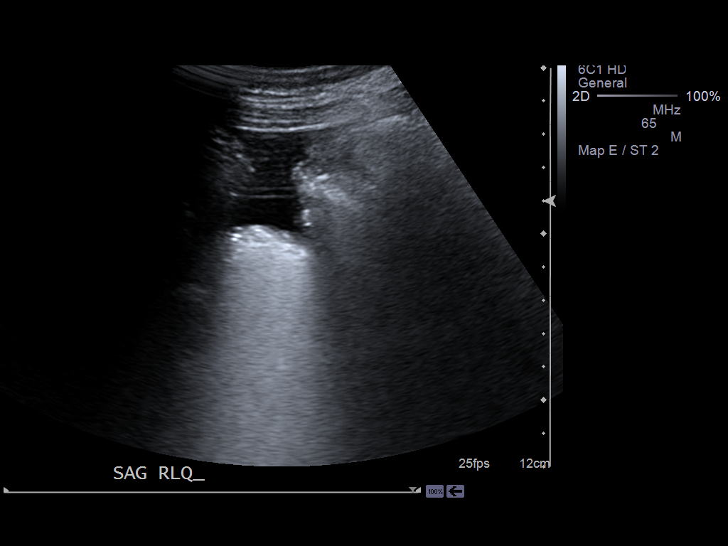
[im 5/10]
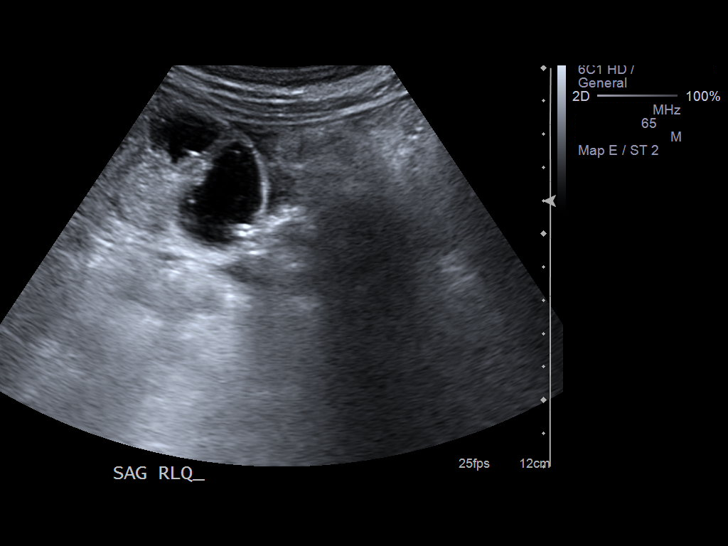
[im 6/10]
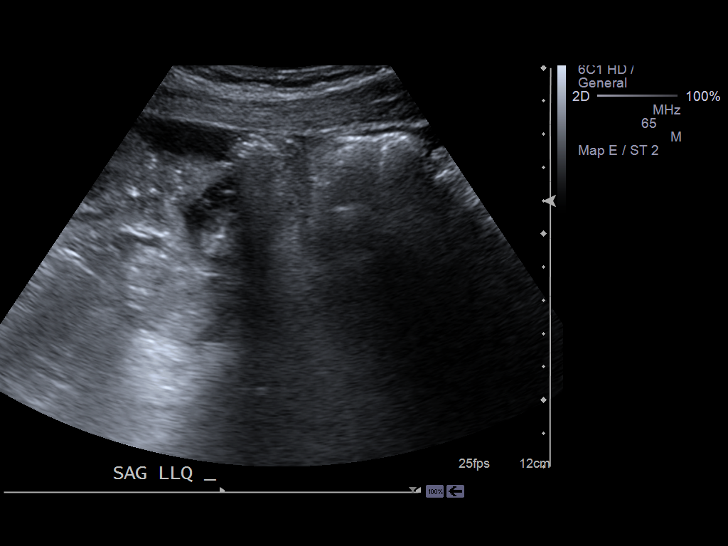
[im 7/10]
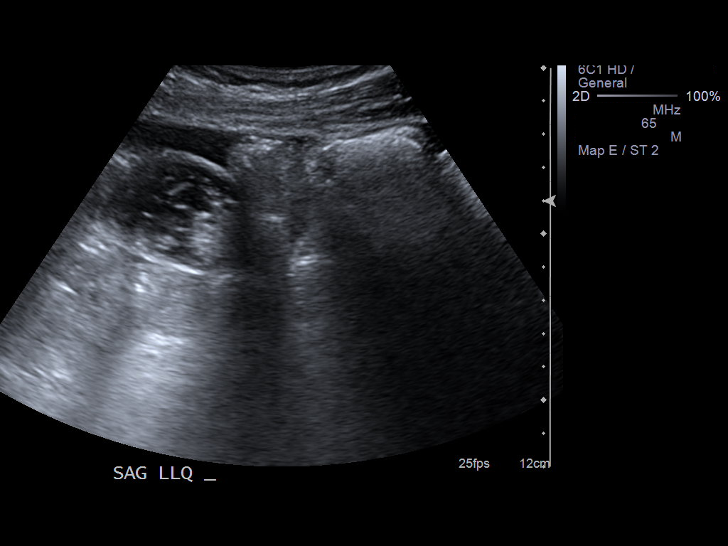
[im 8/10]
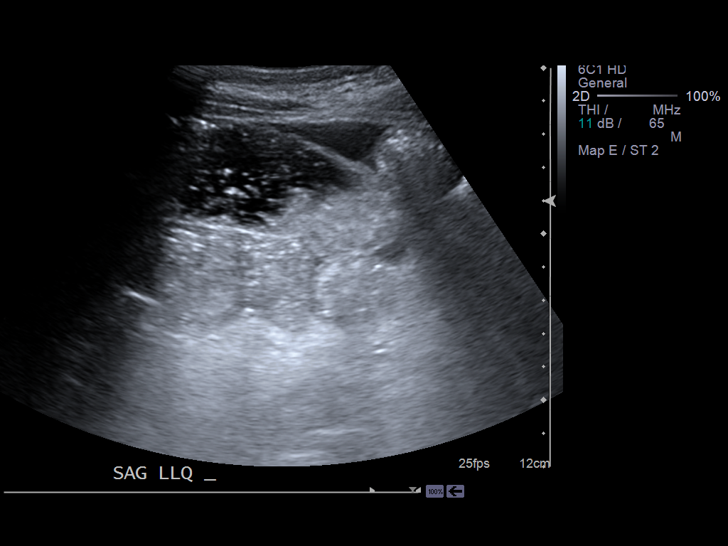
[im 9/10]
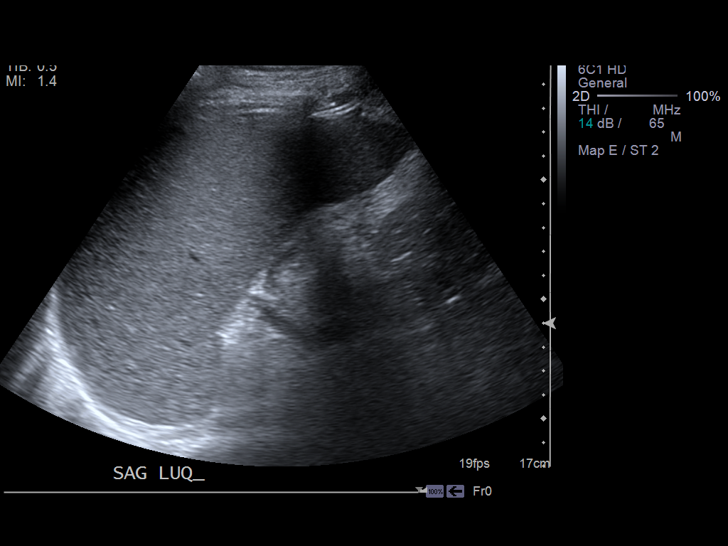
[im 10/10]
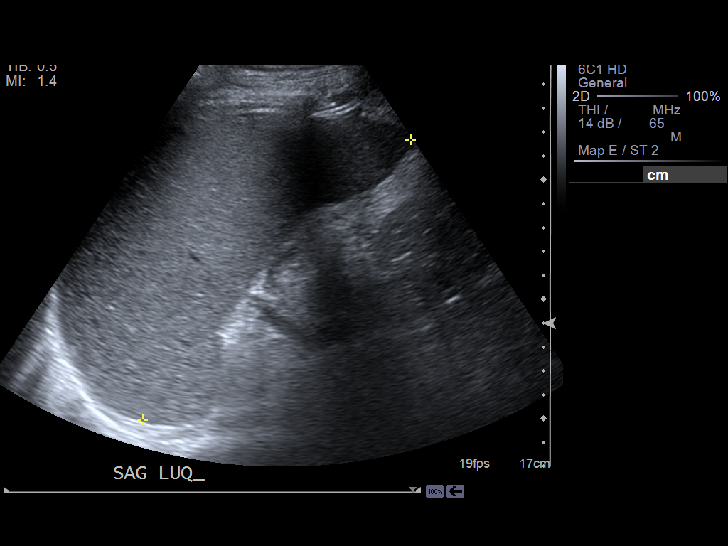

[10 of 10 positions shown; findings below may reference images not displayed]

PROCEDURE:     US  - US ABDOMEN LIMITED SURVEY  - September 12, 2012  [DATE]

RESULT:     Limited abdominal sonogram is performed for ascites. Evaluation
of the quadrant shows a small amount of ascites adjacent to the liver in the
right upper quadrant minimal fluid in the lower quadrants bilaterally.
Spleen is incidentally noted to be enlarged measuring 16.24 cm.
IMPRESSION: Small amount of ascites present. Splenomegaly demonstrated.

[REDACTED]

## 2015-03-19 NOTE — H&P (Signed)
PATIENT NAME:  Kenneth Espinoza, Brownie MR#:  [id number] DATE OF BIRTH:  [birth year]  DATE OF ADMISSION:  09/06/2012  ADDENDUM: Time spent on admission was 60 minutes.  ____________________________ Aurther LoftAdaorah E. Kaidan Harpster, DO aeo:slb D: 09/06/2012 01:13:44 ET T: 09/06/2012 07:46:08 ET JOB#: [id number]  cc: Aurther LoftAdaorah E. Roshawn Lacina, DO, <Dictator> Terrill Alperin E Bora Broner DO ELECTRONICALLY SIGNED 09/13/2012 22:36

## 2015-03-19 NOTE — Op Note (Signed)
PATIENT NAME:  [name], Kenneth Espinoza DATE OF BIRTH:  March 24, 1956  DATE OF PROCEDURE:  09/06/2012  PREOPERATIVE DIAGNOSES:   1. Staphylococcus sepsis. 2. 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   POSTOPERATIVE DIAGNOSES:  1. Staphylococcus sepsis. 2. 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   PROCEDURES:  1. Ultrasound guidance for vascular access to the right basilic vein.  2. Fluoroscopic guidance for placement of catheter.  3. Insertion of a double lumen PICC line right arm.   SURGEON: Renford DillsGregory G. Rayme Bui, MD  ANESTHESIA: Local.   ESTIMATED BLOOD LOSS: Minimal.   INDICATION FOR PROCEDURE: Requiring antibiotics greater than five days.   DESCRIPTION OF PROCEDURE: The patient's right arm was sterilely prepped and draped, and a sterile surgical field was created. The basilic vein was accessed under direct ultrasound guidance without difficulty with a micropuncture needle and permanent image was recorded. 0.018 wire was then placed into the superior vena cava. Peel-away sheath was placed over the wire. A single lumen peripherally inserted central venous catheter was then placed over the wire and the wire and peel-away sheath were removed. The catheter tip was placed into the superior vena cava and was secured at the skin at 37 cm with a sterile dressing. The catheter withdrew blood well and flushed easily with heparinized saline. The patient tolerated procedure well. ____________________________ Renford DillsGregory G. Miley Blanchett, MD ggs:cms D: 09/06/2012 17:05:36 ET T: 09/07/2012 07:51:57 ET  JOB#: [id number] cc: Renford DillsGregory G. [name], MD, <Dictator> Renford DillsGREGORY G [name] MD ELECTRONICALLY SIGNED 09/14/2012 12:34

## 2015-03-19 NOTE — Consult Note (Signed)
Impression: 59yo BM w/ h/o hep C infection and cirrhosis admitted with colitis and who has GPC in the blood.  His u/s did not show a large amount of ascites.  SBP is unlikely.  He has colitis on CT.  He has diarrhea regularly, but is on lactulose.  Agree with sending C. diff PCR.   Continue cipro/metronidiazole for now. Agree with vanco until his BCx return.  If he has CNS, then it is likely a contaminant.  If it is Staph aureus, will need to treat and rule out endocarditis.  Another set of BCx are being taken prior to starting the vanco. He has never had hep C therapy.  Given his cirrhosis, if this would be considered in the future, it should be done at a tertiary care facility.   Electronic Signatures: Wilbur Labuda, Rosalyn GessMichael E (MD)  (Signed on 08-Oct-13 16:32)  Authored  Last Updated: 08-Oct-13 16:32 by Jolie Strohecker, Rosalyn GessMichael E (MD)

## 2015-03-19 NOTE — Consult Note (Signed)
Chief Complaint:   Subjective/Chief Complaint Feels better except for itching and peeling of skin. One BM today. No blood. No nausea or vomiting.   VITAL SIGNS/ANCILLARY NOTES: **Vital Signs.:   09-Oct-13 10:03   Vital Signs Type Routine   Temperature Temperature (F) 98.2   Celsius 36.7   Temperature Source oral   Pulse Pulse 120   Respirations Respirations 20   Systolic BP Systolic BP 741   Diastolic BP (mmHg) Diastolic BP (mmHg) 72   Mean BP 84   Pulse Ox % Pulse Ox % 94   Pulse Ox Activity Level  At rest   Oxygen Delivery Room Air/ 21 %   Brief Assessment:   Additional Physical Exam Dry scaly skin all over. Abdomen remains distended but non tender. BS positive. Awake and alert.   Lab Results: Hepatic:  09-Oct-13 05:07    Bilirubin, Total  1.5   Alkaline Phosphatase 125   SGPT (ALT) 67   SGOT (AST)  192   Total Protein, Serum 7.3   Albumin, Serum  1.8  Routine Chem:  09-Oct-13 05:07    Lipase 360 (Result(s) reported on 07 Sep 2012 at 05:52AM.)   Glucose, Serum  131   BUN 9   Creatinine (comp) 1.24   Sodium, Serum 145   Potassium, Serum 3.9   Chloride, Serum  113   CO2, Serum 24   Calcium (Total), Serum  7.5   Osmolality (calc) 289   eGFR (African American) >60   eGFR (Non-African American) >60 (eGFR values <48m/min/1.73 m2 may be an indication of chronic kidney disease (CKD). Calculated eGFR is useful in patients with stable renal function. The eGFR calculation will not be reliable in acutely ill patients when serum creatinine is changing rapidly. It is not useful in  patients on dialysis. The eGFR calculation may not be applicable to patients at the low and high extremes of body sizes, pregnant women, and vegetarians.)   Anion Gap 8  Routine Hem:  09-Oct-13 05:07    WBC (CBC) 5.7   RBC (CBC)  3.33   Hemoglobin (CBC)  11.5   Hematocrit (CBC)  33.6   Platelet Count (CBC)  106   MCV  101   MCH  34.7   MCHC 34.4   RDW  18.7   Neutrophil % 55.5    Lymphocyte % 21.6   Monocyte % 13.3   Eosinophil % 9.1   Basophil % 0.5   Neutrophil # 3.1   Lymphocyte # 1.2   Monocyte # 0.8   Eosinophil # 0.5   Basophil # 0.0 (Result(s) reported on 07 Sep 2012 at 05:59AM.)   Radiology Results: [place]    08-Oct-13 13:16, [place]   [place]acent[place]    REASON FOR EXAM:    Tense ascites.  COMMENTS:       PROCEDURE: [place] - [place] PARACENTESIS  - Sep 06 2012  1:16PM     RESULT: Minimal ascites noted. Paracentesis not performed.    IMPRESSION:  Minimal amount of ascites.          Verified By: TOsa Craver M.D., MD   Assessment/Plan:  Assessment/Plan:   Assessment Cirrhosis of liver probably sue to hepatitis C. Patient denies ETOH although pattern of transaminase elevation is more consistent with ETOH liver disease. Portal HTN. Gram positive bacteremia from unknown source. Minimal ascites. Doubt SBP. ? Right sided colitis vs colonic edema due to portal hypotension and hypoalbuminemia.    Plan Continue  lasix and spironolactone. Continue lactulose to prevent encephalopathy. Agree with 10 days of cipro and flagyl for suspected colitis. Further recommendations per ID service. Patient will follow up with me in few weeks (order written) for OP EGD to r/o varices as well as for further management of chronic liver disease. Will sign off. please call me if needed. Thanks.   Electronic Signatures: Jill Side (MD)  (Signed 09-Oct-13 12:03)  Authored: Chief Complaint, VITAL SIGNS/ANCILLARY NOTES, Brief Assessment, Lab Results, Radiology Results, Assessment/Plan   Last Updated: 09-Oct-13 12:03 by Jill Side (MD)

## 2015-03-19 NOTE — Consult Note (Signed)
PATIENT NAME:  Kenneth Espinoza, Kenneth Espinoza MR#:  [id number] DATE OF BIRTH:  [birth year]  DATE OF CONSULTATION:  09/06/2012  REFERRING PHYSICIAN:  Hope PigeonVaibhavkumar G. Elisabeth PigeonVachhani, MD   CONSULTING PHYSICIAN:  Rosalyn GessMichael E. Sadie Pickar, MD  REASON FOR CONSULTATION: Colitis and gram-positive cocci bacteremia.   HISTORY OF PRESENT ILLNESS: The patient is a 59 year old black man with a past history significant for hepatitis C infection with cirrhosis who was admitted on 10/08 with abdominal pain for 3 to 4 days. The patient had been in the hospital fairly recently with the possibility of SBP. He was treated but was apparently noncompliant with spironolactone and possibly with antibiotics. He redeveloped his symptoms and presented to the Emergency Room. Initially he was given Zosyn. Blood cultures were obtained on admission and are now growing four out of four bottles positive for gram-positive cocci in clusters. He denies any fevers, chills, or sweats at home. He does have chronic pruritus and a scaly rash that is very diffuse. He has also been having diarrhea, although he takes lactulose regularly. He denies any nausea, vomiting or change in his urine.  He had an ultrasound to have fluid tapped for possible SBP, but the ultrasound just indicated only a small amount of fluid present. A CT scan of the abdomen and pelvis demonstrated colitis being present. The patient is currently on metronidazole and Cipro. A PICC line is being placed, and he is set to start vancomycin as well.   ALLERGIES: None.   PAST MEDICAL HISTORY:  1. Chronic hepatitis C infection.  2. Cirrhosis.  3. 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   SOCIAL HISTORY: The [place] from [place] stay with [place]. He lives with her, but she is often out of the house. He has a [place] at home. He does not smoke. He does not drink. No injecting drug use history. 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   FAMILY HISTORY: Positive for coronary artery disease, prostate cancer, breast cancer and diabetes.    REVIEW OF SYSTEMS: GENERAL: No fevers, chills, or sweats. Positive weight loss with spironolactone use, positive malaise. HEENT: No headaches. No sinus congestion. No sore throat. NECK: No stiffness. No swollen glands. RESPIRATORY: No cough or shortness of breath. No sputum production. CARDIAC: No chest pains or palpitations. GI: No nausea, no vomiting. Positive abdominal distention and abdominal pain. Positive chronic diarrhea related to lactulose. GENITOURINARY: There has been no change in his urination. MUSCULOSKELETAL: No complaints. SKIN: He has chronic scaly rash that is diffusely present and is significantly pruritic. NEUROLOGIC: No focal complaints. PSYCHIATRIC: No complaints. All other systems are negative.   PHYSICAL EXAMINATION:  VITAL SIGNS: T-max 97.4, pulse 83, blood pressure 135/64, 99% on room air.   GENERAL: A 59 year old black man in no acute distress.   HEENT: Normocephalic, atraumatic. Pupils are equal, round and reactive to light.  Extraocular motion intact. Sclerae, conjunctivae, and lids are without evidence for emboli or petechiae. No scleral icterus. Oropharynx shows no erythema or exudate. Gums and lips are in good condition.   NECK: Supple. Full range of motion. Midline trachea. No lymphadenopathy. No thyromegaly.   LUNGS: Clear to auscultation bilaterally with good air movement. No focal consolidation.   HEART: Regular rate and rhythm without murmur, rub, or gallop.   ABDOMEN: Distended, nontender, no obvious hepatosplenomegaly, but it was difficult to examine due to the abdominal distention. He had  no rebound or guarding. There were no hernias appreciated.   EXTREMITIES: No evidence for tenosynovitis.   SKIN: He has scaly skin on both upper extremities. There is no significant erythema, however, there is no jaundice present.  There were no stigmata of endocarditis, specifically no Janeway lesions nor Osler nodes.   NEUROLOGIC: The patient was awake and interactive, moving all four extremities.   PSYCHIATRIC: Mood and affect appeared normal.  LABORATORY, DIAGNOSTIC AND RADIOLOGICAL DATA: BUN 9, creatinine 1.36, bicarbonate 22, anion gap 11 AST 243, ALT 79, alkaline phosphatase 170, total bilirubin 1.7. White count of 7.8, hemoglobin 12.9, platelet count of 107.0. No differential was obtained.  INR was 1.3. Urinalysis was unremarkable. Urine culture shows no growth to date. Blood cultures are growing gram-positive cocci in clusters in four out of four bottles. A 3-way of the abdomen showed nonobstructive bowel gas pattern. A CT scan of the abdomen and pelvis without contrast demonstrated thickened wall of the ascending colon, cecum and proximal transverse colon concerning for colitis. Underlying cirrhosis with splenomegaly and varices and mild ascites were present.   IMPRESSION: A 59 year old black man with a history of hepatitis C infection and cirrhosis admitted with colitis and who has gram-positive cocci in the blood.   RECOMMENDATIONS:  1. His ultrasound did not show a large amount of ascites. SBP is unlikely. He has colitis on CT. He has diarrhea regularly but is on lactulose. I agree with sending a C. difficile PCR.  2. I would continue the Cipro and metronidazole for now.  3. I agree with vancomycin until his blood cultures return. If he has coagulase-negative staph, then this is likely a contaminant. If it is Staph aureus, then we will need to treat and rule out endocarditis. Another set of blood cultures is being taken prior to starting the vancomycin.  4. He has never had any hepatitis C therapy. Given his cirrhosis, if this would be considered in the future it should be done at a tertiary care facility.   This is a high-level Infectious Disease consult. Thank you very much for involving me in Kenneth Espinoza  care.  ____________________________ Rosalyn Gess. Travell Desaulniers, MD meb:cbb D: 09/06/2012 16:42:02 ET T: 09/06/2012 17:31:20 ET JOB#: [id number]  cc: Rosalyn Gess. Gayathri Futrell, MD, <Dictator>  Saul Fabiano E Brinna Divelbiss MD ELECTRONICALLY SIGNED 09/07/2012 8:30

## 2015-03-19 NOTE — Consult Note (Signed)
PATIENT NAME:  Kenneth Espinoza, Kenneth Espinoza MR#:  [id number] DATE OF BIRTH:  1955-12-11  DATE OF CONSULTATION:  09/14/2012  REFERRING PHYSICIAN:  Vaughan Basta, MD CONSULTING PHYSICIAN:  Kenneth Espinoza Kenneth Kapur, MD  REASON FOR CONSULTATION: Acute renal failure.   HISTORY OF PRESENT ILLNESS: The patient is a 59 year old African American male with past medical history of hepatitis C, liver cirrhosis, and chronic pruritus who presented to [facility] with increasing abdominal pain. The patient reports that he was previously living in [place] and relatively recently moved to [place] to help out his sister. He has been off of his medications including spironolactone and lactulose for several months. He then began gaining significant amounts of weight and edema, but once he was placed back on his diuretics he ended up losing 70 to 80 pounds. As above, he presented with abdominal pain. He was found to have sepsis with staph aureus bacteremia. A definitive source was not found. The patient had both transthoracic echocardiogram as well as transesophageal echocardiogram. These were both negative for valvular vegetations. The patient is currently being treated with ceftriaxone. He has had some reduction in his abdominal pain, although he continues to have some abdominal distention. We are now consulted for the evaluation and management of acute renal failure. The patient's creatinine is currently 1.95. Upon presentation creatinine was 1.24 with an eGFR greater than 60. He is currently receiving IV fluid hydration. Earlier in the hospitalization there were periods of hypotension. The patient has also been on Lasix. He also received a one-time dosage of ibuprofen earlier in the hospitalization. He had a urinalysis performed on 09/05/2012. This was negative for protein and negative for significant hematuria.   PAST MEDICAL HISTORY:  1. Cirrhosis of liver.  2. Hepatitis C.  3. Chronic pruritus.    PAST SURGICAL HISTORY: History of paracentesis in the past.   ALLERGIES: No known drug allergies.     CURRENT INPATIENT MEDICATIONS:  1. Tylenol 650 mg p.o. every four hours p.r.n.  2. Benadryl 25 mg p.o. every six hours p.r.n.  3. Lovenox 40 mg subcutaneous daily, which is currently on hold. 4. Lactulose 15 mL p.o. twice a day. 5. Zofran 4 mg IV every four hours p.r.n.  6. Senna 1 tablet p.o. twice a day. 7. Morphine 2 mg IV every four hours p.r.n. pain.  8. Ceftriaxone 2 grams IV every 24 hours. 9. Cyanocobalamin 1000 mcg p.o. daily.  10. Folic acid 1 mg p.o. daily. 11. Oxycodone sustained release 15 mg p.o. every 12 hours. 12. Roxicodone 5 mg p.o. every six hours p.r.n.  13. Metoprolol 25 mg p.o. daily.  14. Hydrocortisone 1% applied to affected areas three times daily.  SOCIAL HISTORY: The patient previously lived in [place]. He is now living here in [place] [address] assisting his daughter. He states that he smokes tobacco on occasion. He denies any alcohol or illicit drug use.   FAMILY HISTORY: The patient's mother is deceased in a motor vehicle accident. The patient's father is alive and has a history of coronary artery disease and prostate cancer.   REVIEW OF SYSTEMS: CONSTITUTIONAL: Previously had fevers but none recently. EYES: Denies diplopia or blurry vision. HEENT: Denies headaches or hearing loss. Denies epistaxis. CARDIOVASCULAR: Currently denies chest pain, palpitations, PND, or orthopnea. RESPIRATORY: Denies cough or hemoptysis. Does have some intermittent shortness of breath. GI: Reports abdominal pain and distention. GU: Denies frequency, urgency, or dysuria. MUSCULOSKELETAL: Denies joint pain, swelling, or redness. INTEGUMENTARY: Reports chronic skin  pruritus. NEUROLOGIC: Denies focal extremity numbness, weakness, or tingling. PSYCHIATRIC: Denies depression or bipolar disorder. ENDOCRINE: Denies polyuria, polydipsia, or polyphagia.  HEMATOLOGIC/LYMPHATIC: Denies easy bruisability, bleeding, or swollen lymph nodes. ALLERGY/IMMUNOLOGIC: Denies seasonal allergies or history of immunodeficiency.   PHYSICAL EXAMINATION:   VITAL SIGNS: Temperature 98.3, pulse 76, respirations 20, blood pressure 130/81, and pulse oximetry 99% on room air.   GENERAL: Well-developed, well-nourished African American male who appears his stated age, currently in no acute distress.   HEENT: Normocephalic, atraumatic. Extraocular movements are intact. Pupils are equal, round, and reactive to light. No scleral icterus but there are muddy brown sclerae. Conjunctivae are pink. No epistaxis noted. Gross hearing intact. Oral mucosa moist. Poor native dentition.   NECK: Supple without JVD or lymphadenopathy.   LUNGS: Clear to auscultation bilaterally with normal respiratory effort.   HEART: S1 and S2 regular rate and rhythm. No murmurs, rubs, or gallops appreciated.   ABDOMEN: Soft. There is distention. There is mild diffuse tenderness, no rebound or guarding however.   EXTREMITIES: 2+ bilateral lower extremity edema noted.   NEUROLOGIC: The patient is alert and oriented to time, person, and place. Strength is five out of five in both upper and lower extremities.   SKIN: Warm and dry. No rashes noted. However, the patient does have some scattered excoriations over his skin from scratching that are rather diffuse in nature.   MUSCULOSKELETAL: No joint redness, swelling, or tenderness appreciated.   PSYCHIATRIC: The patient has an appropriate affect and appears to have good insight into his current illness.   LABS/RADIOLOGIC STUDIES: Sodium 140, potassium 4.6, chloride 109, CO2 24, BUN 12, creatinine 1.95, glucose 118, total protein 6.9, albumin 1.7, total bilirubin 0.9, alkaline phosphatase 99, AST 356, and ALT 200. CBC shows WBC 8.4, hemoglobin 9.8, hematocrit 29.3, and platelets 109.   C. difficile toxin is negative.   Stool culture is negative.  Blood cultures x2 sets performed on 09/05/2012 showed Staphylococcus aureus that is sensitive to oxacillin.   Urinalysis from 09/05/2012 was negative for protein, only 1 RBC per high-power field noted.   TEE from 09/12/2012 showed no valvular vegetations.   IMPRESSION: This is a 59 year old African American male with past medical history of hepatitis C, cirrhosis, chronic pruritus, and history of paracentesis who presented to [facility] with abdominal pain and now found to have acute renal failure.   PROBLEM LIST:  1. Acute renal failure with baseline creatinine of 1.24, current creatinine 1.95.  2. Staphylococcus aureus bacteremia.  3. Liver cirrhosis.  4. Hepatitis C.  5. Extensive lower extremity edema.  6. Malnutrition likely due to liver disease.   PLAN: We were consulted for the evaluation and management of acute renal failure. The patient's presenting creatinine to [facility] was initially 1.36; however, on hospital day two creatinine was down to 1.4, creatinine is now up to 1.95. There have been some periods of hypotension early in the hospitalization. He also had bacteremia/sepsis which may have contributed. Also, the patient was on diuretic therapy at one point in time and was also given one dose of ibuprofen. Therefore, his acute renal failure is likely multifactorial. Agree with continued IV fluid hydration at this time. We will check a renal ultrasound to exclude obstruction. We will also check SPEP, UPEP, and ANA. Urinalysis was fairly unremarkable; therefore, I doubt underlying glomerulonephritis. It appears at the time one would be a bit short for allergic interstitial nephritis. The patient's hepatitis C and cirrhosis are causing a  significant issue as he has malnutrition with an albumin of 1.7 and subsequent edema. However, I would be cautious in overly diuresing the patient now given the acute renal failure. He has underlying bacteremia  which is being treated with ceftriaxone at this time. TEE was negative for valvular vegetation. The patient does have excoriations over his body which may have been the portal of        entry for the staphylococcus aureus. From our perspective, there is no indication for dialysis at this time. I would like to thank Dr. Vaughan Espinoza for this kind referral. Further plan as the patient progresses.  ____________________________ Tama High, MD mnl:slb D: 09/14/2012 12:15:54 ET T: 09/14/2012 12:29:55 ET JOB#: [id number]  cc: Tama High, MD, <Dictator> Tama High MD ELECTRONICALLY SIGNED 10/11/2012 10:43

## 2015-03-19 NOTE — H&P (Signed)
PATIENT NAME:  Kenneth Espinoza, Kenneth Espinoza MR#:  [id number] DATE OF BIRTH:  [birth year]  DATE OF ADMISSION:  09/06/2012  PRIMARY CARE PHYSICIAN: No local M.D.   CHIEF COMPLAINT: Abdominal pain.   HISTORY OF PRESENT ILLNESS: 59 year old male with history of hepatitis C, cirrhosis with history of ascites presents with two week duration of abdominal pain. Notes that he has been off his spironolactone for about two months and that about three months ago he had significant ascites where he weighed about 300 pounds and required significant diuresis and lost about 70 to 80 pounds of fluid. He has been doing well but has been visiting his daughter from [place] as well as [place] and over the last two months has not been compliant with his spironolactone. States his daughter took the prescription and has not felt it. He notes that his abdominal pain is 10/10 abdominal pain, feels like a squeezing sensation particularly over the left side. Pain is intermittent in nature. Nothing makes the pain better and it is worse with positioning. Patient also notes that over the last week or so he has had some intermittent chills and in the ED was found to have a low-grade fever so there is concern for SBP. Today patient had one episode of diarrhea, prior to that actually felt constipated. Denies melena, hematochezia, nausea or vomiting. Over the last few weeks patient has noted progressive increased distention of his abdomen, there is also some lower extremity edema.   Patient notes that he also has significant pruritus. He also has history of chronic pruritus for which he was treated with what sounds like triamcinolone cream when he lived in [place]. He notes that itching gets better with cold water and very good moisturizer, however sweating makes it worse. Over the preceding 1 to 2 weeks itching has been so severe that it has brought him to tears. Patient denies any jaundice. Patient notes that due to the severity of his itching his  skin has become very dry and thickened.   PAST MEDICAL HISTORY: 1. Cirrhosis.  2. Hepatitis C.  3. Chronic pruritus with lichenified skin changes.   PAST SURGICAL HISTORY: Denies any surgical history.   MEDICATIONS:  1. Spironolactone 50 mg twice a day.  2. Lactulose 10 grams/15 mL, 15 mL twice a day.   ALLERGIES: No known drug allergies.   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 He currently is visiting his family from [place]. He was with his sister in [place] recently and now is with his daughter here. Denies illicit drug [place] and alcohol use. Smokes cigarettes occasionally. Patient is involved in the outdoors and has dug for gold.   FAMILY HISTORY: Notable for early coronary artery disease in his brother at age 23 who had CABG. Father has coronary artery disease and prostate cancer. Sister has breast cancer. There is family history of diabetes. Mother is deceased from a motor vehicle one year ago.   REVIEW OF SYSTEMS: CONSTITUTIONAL: Admits to fever, fatigue, 70 pounds weight loss three months ago when he went from 300 to 220 pounds or so. This was fluid weight. EYES: Admits to double vision today and pain in his eyes. ENT: Denies tinnitus, ear pain, epistaxis. RESPIRATORY: Denies cough, painful respirations, wheezing, hemoptysis, dyspnea. CARDIOVASCULAR: Denies chest pain, orthopnea, palpitations, or dyspnea on exertion. Admits to edema in his ankles that just started recently. GASTROINTESTINAL: Denies nausea, vomiting. Admits to diarrhea x1 today. Admits to abdominal pain. Denies melena  or rectal bleeding. GENITOURINARY: Denies dysuria. Admits to dark urine that is chronic as well as hesitation. ENDOCRINE: Admits to some chills. INTEGUMENT: Has diffuse dry skin with lichenified rash. Admits to severe pruritus. MUSCULOSKELETAL: Denies pain in his neck, shoulders, back, knees or hips. Denies joint swelling. NEUROLOGIC: Denies numbness,  dysarthria or headaches. PSYCH: Admits to depression.   PHYSICAL EXAMINATION:  VITAL SIGNS: T-max 99.8, heart rate 91 to 102, respirations 18 to 20, blood pressure 146/78, sating 100% on room air.   GENERAL: African American male in no apparent distress.   PSYCHIATRIC: Awake, alert, oriented x3. Appropriate affect. Judgment intact.   EYES: Pupils equal, round, reactive to light and accommodation. Anicteric sclerae. Normal lids.   ENT: Normal external ears and nares. Posterior pharynx is clear without erythema or exudate.   CARDIOVASCULAR: S1, S2, regular rate and rhythm. No murmurs, rubs, or gallops. There is 1+ edema at the ankles. Symmetrical.   RESPIRATORY: Clear to auscultation bilaterally. No wheezes, rales, or rhonchi. There is normal effort.   ABDOMEN: Abdomen is distended with fluid wave. Abdomen is nontender. There is normal bowel sounds. No hepatomegaly appreciated.   MUSCULOSKELETAL: There is normal tone. No clubbing, cyanosis, or edema.   SKIN: There is diffuse lichenified appearance and thickening of the skin on his face, bilateral upper extremities, his trunk particularly on the left side of his abdomen more so than the right. On his extremities the thickening in the both on the flexor as well as extensor surfaces on the arm and the forearm as well as the posterior aspect of his hand. There is some sparing of the fingers and the MCP joints. He has no area of erythema noted. Otherwise skin is warm and dry. No open wounds noted.   LYMPHATICS: There is a mobile, approximately maybe 1 cm lymph node in the left groin. Otherwise no cervical or axillary lymphadenopathy noted.   LABORATORY, DIAGNOSTIC AND RADIOLOGICAL DATA: Basic metabolic panel shows glucose 126, BUN 9, creatinine 1.36 which is elevated from 1.2 as of 07/2012.   Sodium 143, potassium 3.8, chloride 110, bicarbonate 22, anion gap 11, osmolality 285, calcium 8.1, lipase 459, which is slightly decreased from September  when it was 644. Ammonia 94. Slightly elevated from September when it was 4461.   Liver function tests show total protein 8.7, albumin 2.3, bilirubin 1.7 which is elevated from 1.4, alkaline phosphatase 117 which is elevated from 129 in September, AST is elevated from at 243 today from 144 in September. ALT 79 today which is elevated from 56.   INR 1.3, prothrombin 6.8.   CBC shows WBC count 7.8, hemoglobin 12.9, hematocrit 37.2, platelets 107, MCV 101.   Urinalysis is negative for bilirubin, ketones, nitrites and leukocyte esterase.   CT of the abdomen shows cirrhotic changes of liver. Spleen is enlarged. There are varices noted. There is mild ascites extending into the pelvis. The pancreas is normal. There is some thickening of the wall of the cecum and right colon to the level of the proximal transverse colon. There is also some thickening of the distal transverse colon. There are multiple nonspecific nonpathologic but prominent lymph nodes in the periaortic region. The stomach is incompletely distended. The wall is not well evaluated. The intermittent segmental wall thickening of the colon is compatible with incomplete distention versus a nonspecific colitis. Underlying mass lesion cannot be excluded.    ASSESSMENT AND PLAN: 59 year old male with history of hepatitis C and cirrhosis presents with increasing abdominal distention, abdominal pain, fevers,  diarrhea found to have colitis and there is also concern for SBP.  1. Abdominal pain and fever. Patient is being admitted. He has received Zosyn in the Emergency Department. Will continue empiric antibiotics at this time. Will place a GI consult given the possibility for colitis. Might need a colonoscopy during this hospitalization to rule out inflammatory bowel disease given the colitis distribution on CT. Will attempt paracentesis to evaluate acetic fluid to rule out SBP. Will continue p.r.n. pain medications. Will also diurese the patient given  progressive abdominal distention. Will follow up blood and stool cultures. 2. Cirrhosis with ascites. Will resume patient's lactulose and spironolactone. He is not encephalopathic at this time. Will monitor for encephalopathy. Patient does have varices noted on CAT scan. Will defer to GI regarding medication adjustments and the role of PPIs and nonspecific beta blocker.  3. Transaminitis. Underlying etiology unclear. This might be a result of his hepatitis. Will trend and monitor. Patient is not in acute hepatic failure currently.  4. Elevated lipase. Pancreas is normal on CAT scan. Doubt patient is having acute pancreatitis. Will allow diet at this time and monitor his symptoms. Elevated lipase may be reactive. Check repeat level in the a.m.  5. Pruritus, this is acute on chronic pruritus. I am not sure if his mild bilirubin elevation from prior is contributing to his pruritus, however, he has been treated in the past with what sounds like triamcinolone cream. At this time will add Benadryl and attempt topical steroids as well. Patient has followed with dermatology in the past for this issue and will need referral to outpatient dermatology for ongoing care.  6. CODE STATUS: FULL CODE. Surrogate decision maker is his father, Tim Corriher. He lives in [place]. Phone number is [phone].  7. Prophylaxis. Lovenox.      8. Disposition. Patient is being admitted to the hospitalist service for further care.    ____________________________ Aurther Loft, DO aeo:cms D: 09/06/2012 01:13:04 ET T: 09/06/2012 07:47:06 ET JOB#: [id number]  cc: Aurther Loft, DO, <Dictator>  Arlie Posch E Riggins Cisek DO ELECTRONICALLY SIGNED 09/13/2012 22:36

## 2015-03-19 NOTE — Consult Note (Signed)
Brief Consult Note: Diagnosis: Cirrhosis, ascites, colitis.   Patient was seen by consultant.   Comments: Ptaient with known cirrhosis of liver. Admitted with increasing abdominal distension and low grade fever. Minimal ascites on CT. ? right sided colitis. Mild elevation of lipase, doubt acute pancreatitis. High ammonia without signs of clinical encephalopathy.  Recommendations: Agree with current management. Will see if US guided paracentesis can be done. Further recommendations to follow.  Electronic Signatures: Lurline DelIftikhar, Pius Byrom (MD)  (Signed 08-Oct-13 16:48)  Authored: Brief Consult Note   Last Updated: 08-Oct-13 16:48 by Lurline DelIftikhar, Westlynn Fifer (MD)

## 2015-03-19 NOTE — Op Note (Signed)
PATIENT NAME:  [name], Hiram MR#:  [id number] DATE OF BIRTH:  1956/09/13  DATE OF PROCEDURE:  09/14/2012  PREOPERATIVE DIAGNOSES:   1. Staphylococcus sepsis.  2. 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   POSTOPERATIVE DIAGNOSES:  1. Staphylococcus sepsis.  2. 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   PROCEDURES:  1. Ultrasound guidance for vascular access to the left brachial vein.  2. Fluoroscopic guidance for placement of catheter.  3. Insertion of peripherally inserted single lumen 4 French PICC line left arm.  SURGEON: Renford DillsGregory G. Blue Winther, MD  ANESTHESIA: Local.   ESTIMATED BLOOD LOSS: Minimal.   INDICATION FOR PROCEDURE: Requiring antibiotics greater than five days.   DESCRIPTION OF PROCEDURE: The patient's left arm was sterilely prepped and draped, and a sterile surgical field was created. The left brachial vein was accessed under direct ultrasound guidance without difficulty with a micropuncture needle and permanent image was recorded. 0.018 wire was then placed into the superior vena cava. Peel-away sheath was placed over the wire. A single lumen peripherally inserted central venous catheter was then placed over the wire and the wire and peel-away sheath were removed. The catheter tip was placed into the superior vena cava and was secured at the skin at 43 cm with a sterile dressing. The catheter withdrew blood well and flushed easily with heparinized saline. The patient tolerated procedure well. ____________________________ Renford DillsGregory G. Handsome Anglin, MD ggs:cms D: 09/14/2012 12:28:23 ET T: 09/14/2012 12:33:42 ET  JOB#: [id number] cc: Renford DillsGregory G. Brenya Taulbee, MD, <Dictator> Renford DillsGREGORY G Demica Zook MD ELECTRONICALLY SIGNED 10/11/2012 12:12

## 2015-03-19 NOTE — Discharge Summary (Signed)
PATIENT NAME:  Kenneth Espinoza, KURKA MR#:  [id number] DATE OF BIRTH:  [birth year]  DATE OF ADMISSION:  09/06/2012 DATE OF DISCHARGE:  09/20/2012  DISCHARGE DIAGNOSES:  1. Acute renal failure with a baseline creatinine of 1.24 likely due to diuresis and bacteremia. 2. Methicillin sensitive Staphylococcus aureus bacteremia of unknown source, finished a course of Rocephin.  3. Possible colitis with abdominal pain and distention with a history of hepatitis C covered by Rocephin, not improving.  4. Hepatitis C with cirrhosis. Recommend outpatient tertiary care referral to discuss treatment options and/or possible transplant.  5. Itching, prescribed local steroid cream. Likely due to high bilirubin and cirrhosis.   SECONDARY DIAGNOSES:  1. Cirrhosis.  2. Hepatitis C.  3. 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   CONSULTATIONS:  1. Infectious Disease, Dr. Leavy Cella.  2. Gastroenterology, Dr. Niel Hummer.  3. Nephrology, Dr. Cherylann Ratel.  PROCEDURE: PICC line placement by Dr. Gilda Crease on 09/14/2012.  LABORATORY, DIAGNOSTIC AND RADIOLOGICAL DATA: CT scan of the abdomen and pelvis without contrast on 09/05/2012 showed possible colitis. Underlying cirrhosis with splenomegaly and varices seen. Mild ascites is present. Abdominal three-way with PA of chest on 09/05/2012 showed nonobstructive bowel gas pattern. Ultrasound guided paracentesis on 09/06/2012 was not performed due to minimal ascites. Bilateral lower extremity Doppler on 09/09/2012 showed no evidence of deep vein thrombosis and in bilateral lower extremities. Abdominal ultrasound on 09/12/2012 showed a small amount of ascites, splenomegaly demonstrated. Bilateral kidney ultrasound on 09/14/2012 showed normal renal ultrasound. Splenomegaly without focal abnormality.   HISTORY AND SHORT HOSPITAL COURSE: The patient is a 59 year old male with the above-mentioned medical problems who was admitted for abdominal pain, fever and diarrhea. There was a concern  for colitis.  Please see Dr. Serita Sheller dictated History and Physical for further details. Infectious Disease consultation was obtained with Dr. Leavy Cella who recommended continuing Cipro and/or metronidazole until stool for C. difficile comes back. Also, blood culture was growing gram-positive cocci for which he recommended vancomycin. The patient was also evaluated by GI, Dr. Niel Hummer, who recommended ultrasound; and if ascites was found, possible ultrasound-guided paracentesis was recommended. The patient was slowly improving. He did have unsuccessful attempt for paracentesis under ultrasound guidance. There was very minimal fluid, so no fluid was drained. The patient was growing methicillin sensitive Staphylococcus aureus for which Dr. Leavy Cella changed the antibiotic to Rocephin and recommended a total 14-day course. A PICC line was placed, and antibiotics were continued through the 09/20/2012.  His symptoms were slowly improving. Nephrology consultation was obtained with Dr. Cherylann Ratel, considering acute renal failure. The patient was started on Aldactone and Lasix was increased considering the ascites. The patient did very well. He underwent a 2-D echocardiogram to rule out any vegetation on 09/08/2012 and had a normal ejection fraction of more than 55% with moderate concentric left ventricular hypertrophy. He also had a TEE on 09/12/2012 which was negative for any valvular vegetation. It also confirmed ejection fraction of more than 55%. He had a blood culture growing methicillin-sensitive Staph aureus on 09/05/2012, and stool was negative for any pathogens of C. difficile. His urine eosinophil was negative along with urine electrophoresis. The patient was close to his baseline and was discharged home on 09/20/2012.   PERTINENT DISCHARGE PHYSICAL EXAMINATION:  VITAL SIGNS: On the day of discharge, his temperature was 98.7, heart rate 97 per minute, respirations 20 per minute, blood pressure 103/42 mmHg. He was  saturating 97% on room air. CARDIOVASCULAR: S1, S2 normal. No murmurs, rubs or gallop. LUNGS: Clear to auscultation  bilaterally. No wheezing, rales, rhonchi, or crepitation. ABDOMEN: Soft, minimal distention. No organomegaly appreciated. NEUROLOGIC: Nonfocal examination. All other physical examination remained at the baseline. SKIN: His skin did have lichenification and dry skin with continuous itching.   DISCHARGE MEDICATIONS:  1. Lactulose 15 mL p.o. b.i.d.  2. Spironolactone 50 mg p.o. b.i.d.  3. Oxycodone 5 mg p.o. every 6 hours for 5 days as needed.  4. Nadolol 20 mg p.o. daily.  5. Hydrocortisone topical 1% lotion, apply to affected area t.i.d.    DISCHARGE DIET: Low sodium.   DISCHARGE ACTIVITY: As tolerated.   DISCHARGE INSTRUCTIONS AND FOLLOWUP:  1. The patient was instructed to follow up with his new primary care physician while in [place] when he leaves with his sister in 1 to 2 weeks.  2. He will need followup with Dr. Wynelle LinkKolluru  from Nephrology on 09/26/2012 at 9:30 a.m.   TOTAL TIME DISCHARGING THIS PATIENT: 55 minutes.  ____________________________ Ellamae SiaVipul S. Sherryll BurgerShah, MD vss:cbb D: 09/22/2012 15:19:59 ET T: 09/23/2012 10:18:42 ET JOB#: [id number]  cc: Johnny Latu S. Sherryll BurgerShah, MD, <Dictator> Munsoor Lizabeth LeydenN. Lateef, MD Rosalyn GessMichael E. Blocker, MD Lurline DelShaukat Iftikhar, MD Primary care physician in [place] [facility] MD ELECTRONICALLY SIGNED 09/23/2012 11:27

## 2015-03-19 NOTE — Consult Note (Signed)
PATIENT NAME:  Kenneth Espinoza, Loyed MR#:  [id number] DATE OF BIRTH:  30-Sep-1956  DATE OF CONSULTATION:  09/07/2012  REFERRING PHYSICIAN:   CONSULTING PHYSICIAN:  Lurline DelShaukat Nyzir Dubois, MD  REASON FOR CONSULTATION: Cirrhosis of the liver, hepatitis C, and abdominal pain.   HISTORY OF PRESENT ILLNESS: This is a 59 year old male with history of hepatitis C and cirrhosis with history of ascites. The patient was admitted with some diffuse abdominal pain and itching and scaling of his skin. Apparently he has not been taking his spironolactone for quite some time. The patient has a history of significant ascites in the past and had responded to diuresis. The patient was also complaining of some chills and was found to have a low-grade fever in the Emergency Room. His abdomen was somewhat distended and he was admitted with a suspected diagnosis of ascites with SBP. The patient is complaining of pruritus for about two weeks according to him with skin that is getting more scaly and was peeling recently. CT scan of the abdomen and pelvis was done in the Emergency Room which showed only minimal ascites. Mild edema of the ascending and part of the transverse colon was noted and a possibility of mild colitis was raised by the radiologist. The patient denied significant diarrhea. He is having on average one bowel movement a day but he is on lactulose and his bowel movements are somewhat soft. In the hospital, the patient's blood culture four out of four came back positive for gram-positive cocci and infectious disease was on the case and the patient is currently being treated with vancomycin for gram-positive bacteremia of unknown source.   PAST MEDICAL HISTORY:  1. History of hepatitis C.  2. History of cirrhosis.  3. Chronic pruritus with  skin changes.   PAST SURGICAL HISTORY: None.   HOME MEDICATIONS:  Spironolactone and lactulose.   ALLERGIES: None.   SOCIAL HISTORY: The patient denies the use of alcohol or illicit  drugs. He is originally from [place].   REVIEW OF SYSTEMS: Grossly negative except for what is mentioned in the History of Present Illness.   PHYSICAL EXAMINATION:  GENERAL: Patient does not appear to be in any acute distress. He is quite awake and alert. He has been afebrile since admission.   VITAL SIGNS: Temperature 98.2, heart rate is in 80s mostly, although there has been some documentation of heart rate as high as 130, respirations about 18 to 20, blood pressure 110/72.    SKIN: Clinically he is not jaundiced.  Skin is dry, scaly, and peeling in places.   LUNGS: Grossly clear to auscultation bilaterally with fair air entry and no added sounds.   CARDIOVASCULAR: Regular rate and rhythm. No gallops or murmur.   ABDOMEN: Somewhat globular and distended abdomen.   ABDOMEN: Soft. Bowel sounds are positive. No significant tenderness, rebound or guarding was noted.   NEUROLOGIC: He is quite awake, alert, and oriented. No clinical signs of encephalopathy.   LABORATORY, DIAGNOSTIC, AND RADIOLOGICAL DATA: His white cell count is 7.8, hemoglobin 12.9, hematocrit 37, and a platelet count 107. Electrolytes are fairly unremarkable. BUN 9, creatinine is somewhat elevated at 1.36, total bilirubin is 1.7, alkaline phosphatase is 170, ALT 79, AST 243. Serum albumin is only 1.8. Serum ammonia is 94. INR is 1.3. Blood cultures as above. Ultrasound of the abdomen shows only minimal ascites.   ASSESSMENT AND PLAN: The patient is with:  1. Hepatitis C.  2. Cirrhosis of liver.  3. Portal hypertension. The patient does hepatosplenomegaly and  some intra-abdominal varices.  4. Questionable SBP. I doubt SBP as he has only minimal ascites.  5. Suspected colitis. The patient has some thickening of the wall of the ascending and part of the transverse colon. This could be secondary to severe hypoalbuminemia and portal hypertension, although I agree with treatment with Cipro and Flagyl for 10 days.   6. Gram-positive bacteremia.  The source is unknown, although this could very well be his skin lesions. The patient is currently being followed by ID and is currently being treated with vancomycin.  7. Somewhat elevated lipase. I doubt acute pancreatitis and his lipase is normal.  8. High ammonia. Clinically does not appear to be encephalopathic, although we will continue lactulose.   In summary, I would recommend continuing Cipro and Flagyl for 10 days for suspected colitis. Continue spironolactone and Lasix. Continue lactulose to prevent hepatic encephalopathy. The patient will be followed up as outpatient and will require an upper GI endoscopy to rule out esophageal varices. In case varices are found he will be a candidate for nonselective beta blockers. Use of Cipro will also cover SBP, which even though clinically unlikely is certainly possible. No further recommendations. We will sign off and follow him as outpatient.    ____________________________ Lurline Del, MD si:bjt D: 09/07/2012 12:31:13 ET T: 09/07/2012 12:48:26 ET JOB#: [id number]  cc: Lurline Del, MD, <Dictator> Lurline Del MD ELECTRONICALLY SIGNED 09/20/2012 17:24
